# Patient Record
Sex: Female | Born: 1935
Health system: Southern US, Community
[De-identification: ages and names within clinical notes are randomized; demographics above are authoritative.]

## PROBLEM LIST (undated history)

## (undated) DIAGNOSIS — I679 Cerebrovascular disease, unspecified: Secondary | ICD-10-CM

## (undated) DIAGNOSIS — I10 Essential (primary) hypertension: Secondary | ICD-10-CM

## (undated) DIAGNOSIS — K529 Noninfective gastroenteritis and colitis, unspecified: Secondary | ICD-10-CM

## (undated) DIAGNOSIS — E039 Hypothyroidism, unspecified: Secondary | ICD-10-CM

## (undated) DIAGNOSIS — K810 Acute cholecystitis: Secondary | ICD-10-CM

## (undated) DIAGNOSIS — T4145XA Adverse effect of unspecified anesthetic, initial encounter: Secondary | ICD-10-CM

## (undated) DIAGNOSIS — E785 Hyperlipidemia, unspecified: Secondary | ICD-10-CM

## (undated) DIAGNOSIS — I2510 Atherosclerotic heart disease of native coronary artery without angina pectoris: Secondary | ICD-10-CM

## (undated) DIAGNOSIS — H3530 Unspecified macular degeneration: Secondary | ICD-10-CM

## (undated) DIAGNOSIS — T8859XA Other complications of anesthesia, initial encounter: Secondary | ICD-10-CM

## (undated) DIAGNOSIS — M1990 Unspecified osteoarthritis, unspecified site: Secondary | ICD-10-CM

## (undated) DIAGNOSIS — H18519 Endothelial corneal dystrophy, unspecified eye: Secondary | ICD-10-CM

## (undated) DIAGNOSIS — N179 Acute kidney failure, unspecified: Secondary | ICD-10-CM

## (undated) DIAGNOSIS — H1851 Endothelial corneal dystrophy: Secondary | ICD-10-CM

## (undated) DIAGNOSIS — K859 Acute pancreatitis without necrosis or infection, unspecified: Secondary | ICD-10-CM

## (undated) HISTORY — DX: Unspecified osteoarthritis, unspecified site: M19.90

## (undated) HISTORY — DX: Noninfective gastroenteritis and colitis, unspecified: K52.9

## (undated) HISTORY — DX: Unspecified macular degeneration: H35.30

## (undated) HISTORY — DX: Acute cholecystitis: K81.0

## (undated) HISTORY — DX: Essential (primary) hypertension: I10

## (undated) HISTORY — DX: Acute pancreatitis without necrosis or infection, unspecified: K85.90

## (undated) HISTORY — DX: Acute kidney failure, unspecified: N17.9

## (undated) HISTORY — DX: Cerebrovascular disease, unspecified: I67.9

## (undated) HISTORY — DX: Hypothyroidism, unspecified: E03.9

## (undated) HISTORY — DX: Atherosclerotic heart disease of native coronary artery without angina pectoris: I25.10

## (undated) HISTORY — PX: APPENDECTOMY: SHX54

## (undated) HISTORY — PX: BACK SURGERY: SHX140

## (undated) HISTORY — DX: Hyperlipidemia, unspecified: E78.5

## (undated) HISTORY — PX: CHOLECYSTECTOMY: SHX55

---

## 1998-03-12 ENCOUNTER — Ambulatory Visit (HOSPITAL_COMMUNITY): Admission: RE | Admit: 1998-03-12 | Discharge: 1998-03-12 | Payer: Self-pay | Admitting: *Deleted

## 1998-11-04 ENCOUNTER — Other Ambulatory Visit: Admission: RE | Admit: 1998-11-04 | Discharge: 1998-11-04 | Payer: Self-pay | Admitting: *Deleted

## 2000-02-19 ENCOUNTER — Other Ambulatory Visit: Admission: RE | Admit: 2000-02-19 | Discharge: 2000-02-19 | Payer: Self-pay | Admitting: *Deleted

## 2001-02-27 ENCOUNTER — Other Ambulatory Visit: Admission: RE | Admit: 2001-02-27 | Discharge: 2001-02-27 | Payer: Self-pay | Admitting: *Deleted

## 2003-03-28 ENCOUNTER — Other Ambulatory Visit: Admission: RE | Admit: 2003-03-28 | Discharge: 2003-03-28 | Payer: Self-pay | Admitting: *Deleted

## 2003-10-08 ENCOUNTER — Encounter: Admission: RE | Admit: 2003-10-08 | Discharge: 2003-11-11 | Payer: Self-pay | Admitting: *Deleted

## 2005-02-18 ENCOUNTER — Encounter: Admission: RE | Admit: 2005-02-18 | Discharge: 2005-02-18 | Payer: Self-pay | Admitting: Obstetrics and Gynecology

## 2005-07-22 ENCOUNTER — Encounter: Admission: RE | Admit: 2005-07-22 | Discharge: 2005-07-22 | Payer: Self-pay | Admitting: Obstetrics and Gynecology

## 2005-08-06 ENCOUNTER — Encounter: Admission: RE | Admit: 2005-08-06 | Discharge: 2005-08-06 | Payer: Self-pay | Admitting: Obstetrics and Gynecology

## 2005-10-19 ENCOUNTER — Ambulatory Visit (HOSPITAL_COMMUNITY): Admission: RE | Admit: 2005-10-19 | Discharge: 2005-10-19 | Payer: Self-pay | Admitting: Allergy

## 2006-10-12 ENCOUNTER — Encounter: Admission: RE | Admit: 2006-10-12 | Discharge: 2006-10-12 | Payer: Self-pay | Admitting: Obstetrics and Gynecology

## 2007-02-13 ENCOUNTER — Inpatient Hospital Stay (HOSPITAL_COMMUNITY): Admission: EM | Admit: 2007-02-13 | Discharge: 2007-02-24 | Payer: Self-pay | Admitting: *Deleted

## 2007-02-13 ENCOUNTER — Ambulatory Visit: Payer: Self-pay | Admitting: Cardiology

## 2007-02-14 HISTORY — PX: CARDIAC CATHETERIZATION: SHX172

## 2007-02-15 ENCOUNTER — Ambulatory Visit: Payer: Self-pay | Admitting: Cardiothoracic Surgery

## 2007-02-15 ENCOUNTER — Encounter: Payer: Self-pay | Admitting: Cardiology

## 2007-02-15 ENCOUNTER — Encounter: Payer: Self-pay | Admitting: Vascular Surgery

## 2007-02-15 HISTORY — PX: CORONARY ARTERY BYPASS GRAFT: SHX141

## 2007-03-13 ENCOUNTER — Ambulatory Visit: Payer: Self-pay | Admitting: Cardiology

## 2007-03-13 ENCOUNTER — Ambulatory Visit: Payer: Self-pay

## 2007-03-17 ENCOUNTER — Ambulatory Visit: Payer: Self-pay | Admitting: Cardiothoracic Surgery

## 2007-03-23 ENCOUNTER — Encounter (HOSPITAL_COMMUNITY): Admission: RE | Admit: 2007-03-23 | Discharge: 2007-06-21 | Payer: Self-pay | Admitting: Cardiology

## 2007-03-24 ENCOUNTER — Ambulatory Visit: Payer: Self-pay | Admitting: Cardiothoracic Surgery

## 2007-04-07 ENCOUNTER — Ambulatory Visit: Payer: Self-pay | Admitting: Cardiothoracic Surgery

## 2007-04-17 ENCOUNTER — Ambulatory Visit: Payer: Self-pay | Admitting: Cardiology

## 2007-05-23 ENCOUNTER — Ambulatory Visit: Payer: Self-pay | Admitting: Cardiology

## 2007-05-29 ENCOUNTER — Ambulatory Visit: Payer: Self-pay

## 2007-06-01 ENCOUNTER — Ambulatory Visit: Payer: Self-pay | Admitting: Cardiology

## 2007-06-01 LAB — CONVERTED CEMR LAB
CO2: 29 meq/L (ref 19–32)
Calcium: 9.2 mg/dL (ref 8.4–10.5)
Chloride: 104 meq/L (ref 96–112)
Creatinine, Ser: 1 mg/dL (ref 0.4–1.2)
Glucose, Bld: 86 mg/dL (ref 70–99)
Sodium: 138 meq/L (ref 135–145)

## 2007-06-02 ENCOUNTER — Ambulatory Visit: Payer: Self-pay | Admitting: Cardiovascular Disease

## 2007-06-22 ENCOUNTER — Encounter: Admission: RE | Admit: 2007-06-22 | Discharge: 2007-07-20 | Payer: Self-pay | Admitting: Cardiology

## 2007-06-28 ENCOUNTER — Ambulatory Visit: Payer: Self-pay | Admitting: Cardiology

## 2007-09-21 ENCOUNTER — Inpatient Hospital Stay (HOSPITAL_COMMUNITY): Admission: EM | Admit: 2007-09-21 | Discharge: 2007-09-23 | Payer: Self-pay | Admitting: Emergency Medicine

## 2007-09-28 ENCOUNTER — Encounter: Admission: RE | Admit: 2007-09-28 | Discharge: 2007-09-28 | Payer: Self-pay | Admitting: Gastroenterology

## 2007-10-02 ENCOUNTER — Ambulatory Visit (HOSPITAL_COMMUNITY): Admission: RE | Admit: 2007-10-02 | Discharge: 2007-10-02 | Payer: Self-pay | Admitting: Gastroenterology

## 2007-10-03 ENCOUNTER — Encounter (INDEPENDENT_AMBULATORY_CARE_PROVIDER_SITE_OTHER): Payer: Self-pay | Admitting: Gastroenterology

## 2007-10-03 ENCOUNTER — Ambulatory Visit (HOSPITAL_COMMUNITY): Admission: RE | Admit: 2007-10-03 | Discharge: 2007-10-03 | Payer: Self-pay | Admitting: Gastroenterology

## 2007-10-03 HISTORY — PX: HEMORRHOID SURGERY: SHX153

## 2007-10-05 ENCOUNTER — Ambulatory Visit: Payer: Self-pay | Admitting: Cardiology

## 2007-11-23 ENCOUNTER — Encounter: Admission: RE | Admit: 2007-11-23 | Discharge: 2007-11-23 | Payer: Self-pay | Admitting: Obstetrics and Gynecology

## 2007-12-11 ENCOUNTER — Ambulatory Visit: Payer: Self-pay | Admitting: Cardiology

## 2007-12-25 ENCOUNTER — Encounter: Admission: RE | Admit: 2007-12-25 | Discharge: 2007-12-25 | Payer: Self-pay | Admitting: Gastroenterology

## 2007-12-26 ENCOUNTER — Ambulatory Visit (HOSPITAL_COMMUNITY): Admission: RE | Admit: 2007-12-26 | Discharge: 2007-12-26 | Payer: Self-pay | Admitting: Gastroenterology

## 2007-12-26 HISTORY — PX: ERCP W/ SPHINCTEROTOMY AND BALLOON DILATION: SHX1524

## 2008-01-24 ENCOUNTER — Ambulatory Visit: Payer: Self-pay | Admitting: Cardiology

## 2008-04-15 ENCOUNTER — Encounter: Admission: RE | Admit: 2008-04-15 | Discharge: 2008-04-15 | Payer: Self-pay | Admitting: Gastroenterology

## 2008-05-06 ENCOUNTER — Ambulatory Visit: Payer: Self-pay | Admitting: Cardiology

## 2008-05-20 ENCOUNTER — Ambulatory Visit: Payer: Self-pay | Admitting: Cardiology

## 2008-05-20 LAB — CONVERTED CEMR LAB
ALT: 22 units/L (ref 0–35)
Alkaline Phosphatase: 30 units/L — ABNORMAL LOW (ref 39–117)
Bilirubin, Direct: 0.1 mg/dL (ref 0.0–0.3)
Cholesterol: 218 mg/dL (ref 0–200)
Direct LDL: 153.9 mg/dL
Total Bilirubin: 1.1 mg/dL (ref 0.3–1.2)
Total CHOL/HDL Ratio: 6.6

## 2008-10-28 ENCOUNTER — Ambulatory Visit: Payer: Self-pay | Admitting: Cardiology

## 2008-11-27 ENCOUNTER — Ambulatory Visit: Payer: Self-pay

## 2008-11-29 ENCOUNTER — Encounter: Admission: RE | Admit: 2008-11-29 | Discharge: 2008-11-29 | Payer: Self-pay | Admitting: Family Medicine

## 2009-02-04 ENCOUNTER — Ambulatory Visit: Payer: Self-pay | Admitting: Cardiology

## 2009-02-12 ENCOUNTER — Ambulatory Visit: Payer: Self-pay

## 2009-02-12 ENCOUNTER — Encounter: Payer: Self-pay | Admitting: Cardiology

## 2009-02-19 ENCOUNTER — Ambulatory Visit: Payer: Self-pay | Admitting: Cardiology

## 2009-03-11 DIAGNOSIS — I2510 Atherosclerotic heart disease of native coronary artery without angina pectoris: Secondary | ICD-10-CM | POA: Insufficient documentation

## 2009-03-11 DIAGNOSIS — I679 Cerebrovascular disease, unspecified: Secondary | ICD-10-CM

## 2009-03-11 DIAGNOSIS — E785 Hyperlipidemia, unspecified: Secondary | ICD-10-CM | POA: Insufficient documentation

## 2009-03-11 DIAGNOSIS — E039 Hypothyroidism, unspecified: Secondary | ICD-10-CM | POA: Insufficient documentation

## 2009-03-11 DIAGNOSIS — R079 Chest pain, unspecified: Secondary | ICD-10-CM | POA: Insufficient documentation

## 2009-03-11 DIAGNOSIS — I10 Essential (primary) hypertension: Secondary | ICD-10-CM | POA: Insufficient documentation

## 2009-03-12 ENCOUNTER — Encounter (INDEPENDENT_AMBULATORY_CARE_PROVIDER_SITE_OTHER): Payer: Self-pay | Admitting: *Deleted

## 2009-03-12 ENCOUNTER — Ambulatory Visit: Payer: Self-pay | Admitting: Cardiology

## 2009-03-12 ENCOUNTER — Encounter: Payer: Self-pay | Admitting: Cardiology

## 2009-05-12 LAB — CONVERTED CEMR LAB
ALT: 31 units/L (ref 0–35)
AST: 33 units/L (ref 0–37)
Albumin: 3.6 g/dL (ref 3.5–5.2)
Alkaline Phosphatase: 35 units/L — ABNORMAL LOW (ref 39–117)
BUN: 16 mg/dL (ref 6–23)
Chloride: 103 meq/L (ref 96–112)
Cholesterol: 148 mg/dL (ref 0–200)
Direct LDL: 74.1 mg/dL
HDL: 34 mg/dL — ABNORMAL LOW (ref >39.0)
Pro B Natriuretic peptide (BNP): 77 pg/mL (ref 0.0–100.0)
TSH: 1.55 microintl units/mL (ref 0.35–5.50)
Total Bilirubin: 0.8 mg/dL (ref 0.3–1.2)
Total CHOL/HDL Ratio: 4.4

## 2009-05-13 ENCOUNTER — Encounter (INDEPENDENT_AMBULATORY_CARE_PROVIDER_SITE_OTHER): Payer: Self-pay | Admitting: *Deleted

## 2009-07-14 ENCOUNTER — Encounter (INDEPENDENT_AMBULATORY_CARE_PROVIDER_SITE_OTHER): Payer: Self-pay | Admitting: *Deleted

## 2009-09-03 ENCOUNTER — Encounter: Payer: Self-pay | Admitting: Cardiology

## 2009-09-15 ENCOUNTER — Ambulatory Visit: Payer: Self-pay | Admitting: Cardiology

## 2009-12-09 ENCOUNTER — Encounter: Payer: Self-pay | Admitting: Cardiology

## 2009-12-10 ENCOUNTER — Ambulatory Visit: Payer: Self-pay

## 2009-12-10 ENCOUNTER — Encounter: Payer: Self-pay | Admitting: Cardiovascular Disease

## 2009-12-10 ENCOUNTER — Encounter: Payer: Self-pay | Admitting: Cardiology

## 2009-12-24 ENCOUNTER — Telehealth (INDEPENDENT_AMBULATORY_CARE_PROVIDER_SITE_OTHER): Payer: Self-pay | Admitting: *Deleted

## 2010-01-23 ENCOUNTER — Telehealth: Payer: Self-pay | Admitting: Cardiology

## 2010-01-26 ENCOUNTER — Inpatient Hospital Stay (HOSPITAL_COMMUNITY): Admission: EM | Admit: 2010-01-26 | Discharge: 2010-02-06 | Payer: Self-pay | Admitting: Emergency Medicine

## 2010-01-26 ENCOUNTER — Ambulatory Visit: Payer: Self-pay | Admitting: Cardiology

## 2010-01-27 ENCOUNTER — Encounter (INDEPENDENT_AMBULATORY_CARE_PROVIDER_SITE_OTHER): Payer: Self-pay

## 2010-01-28 HISTORY — PX: CHOLECYSTECTOMY, LAPAROSCOPIC: SHX56

## 2010-03-27 ENCOUNTER — Encounter
Admission: RE | Admit: 2010-03-27 | Discharge: 2010-03-27 | Payer: Self-pay | Source: Home / Self Care | Admitting: Gastroenterology

## 2010-08-12 ENCOUNTER — Encounter (INDEPENDENT_AMBULATORY_CARE_PROVIDER_SITE_OTHER): Payer: Self-pay | Admitting: *Deleted

## 2010-09-14 ENCOUNTER — Ambulatory Visit: Payer: Self-pay | Admitting: Cardiology

## 2010-12-25 ENCOUNTER — Encounter: Payer: Self-pay | Admitting: Cardiology

## 2010-12-25 ENCOUNTER — Telehealth: Payer: Self-pay | Admitting: Cardiology

## 2010-12-28 ENCOUNTER — Encounter (INDEPENDENT_AMBULATORY_CARE_PROVIDER_SITE_OTHER): Payer: Self-pay | Admitting: *Deleted

## 2011-01-19 NOTE — Progress Notes (Signed)
   Phone Note Other Incoming   Caller: Sara[facility] Action Taken: Information Sent Initial call taken by: Denny Peon    Faxed Doppler,12 lead,LOV over to [place] to fax [phone] [facility]  December 24, 2009 10:23 AM

## 2011-01-19 NOTE — Progress Notes (Signed)
        Additional Follow-up for Phone Call Additional follow up Details #2::    metoprolol needs 90 day suppl printed needs Dr Jens Som to sign will get it Monday when Dr Jens Som returns to the office Follow-up by: Kem Parkinson,  January 23, 2010 8:48 AM  Additional Follow-up for Phone Call Additional follow up Details #3:: Details for Additional Follow-up Action Taken: I got Dr Jens Som to sign the prescription sat med letter out in the lobby Additional Follow-up by: Kem Parkinson,  January 26, 2010 9:49 AM  Prescriptions: METOPROLOL SUCCINATE 50 MG XR24H-TAB (METOPROLOL SUCCINATE) Take one tablet by mouth daily  #90.0 Each x 3   Entered by:   Kem Parkinson   Authorized by:   Ferman Hamming, MD, [facility]   Signed by:   Kem Parkinson on 01/26/2010   Method used:   Print then Give to Patient   RxID:   [id number]

## 2011-01-19 NOTE — Consult Note (Signed)
[facility]: [facility]   Imported By: Roderic Ovens 02/09/2010 15:05:10  _____________________________________________________________________  External Attachment:    Type:   Image     Comment:   External Document

## 2011-01-19 NOTE — Assessment & Plan Note (Signed)
Summary: F1Y/DM  Medications Added SYNTHROID 50 MCG TABS (LEVOTHYROXINE SODIUM) 1 tab by mouth once daily.Marland Kitchenexcept the days she takes the 75mg  BIOTIN 1000 MCG TABS (BIOTIN) 1 tab by mouth once daily SYNTHROID 75 MCG TABS (LEVOTHYROXINE SODIUM) 2 times a week        Primary [name]:  knapp,eve   History of Present Illness: [name] returns for followup today. She had coronary artery bypass and graft in 2008. When I saw her back in February of 2010 she was complaining of shortness of breath and chest pain.  An echocardiogram in February showed normal LV function, mild left atrial enlargement, and trivial mitral regurgitation.  A Myoview in February showed no ischemia or infarction and an ejection fraction of 69%.  We also scheduled her to have a D-dimer due to her dyspnea, which was normal.  A BNP was also scheduled due to her dyspnea and it was normal at 77.  Finally, a chest x-ray showed no active lung disease. Last carotid Dopplers were performed in December of 2010. There was 0-39% bilateral stenosis. I last saw her in September of 2010. Since then, the patient denies any dyspnea on exertion, orthopnea, PND, pedal edema, palpitations, syncope or chest pain.   Current Medications (verified): 1)  Synthroid 50 Mcg Tabs (Levothyroxine Sodium) .Marland Kitchen.. 1 Tab By Mouth Once Daily.Marland Kitchenexcept The Days She Takes The 75mg  2)  Metoprolol Succinate 50 Mg Xr24h-Tab (Metoprolol Succinate) .... Take One Tablet By Mouth Daily 3)  Mavik 1 Mg Tabs (Trandolapril) .Marland Kitchen.. 1 Tab By Mouth Once Daily 4)  Aspirin 81 Mg Tbec (Aspirin) .... Take One Tablet By Mouth Daily 5)  Fish Oil   Oil (Fish Oil) .... 2 Tabs By Mouth Once Daily 6)  Coenzyme Q10 10 Mg Caps (Coenzyme Q10) .Marland Kitchen.. 1 Tab By Mouth Once Daily 7)  Glucosamine-Chondroitin   Caps (Glucosamine-Chondroit-Vit C-Mn) .Marland Kitchen.. 1 Tab By Mouth Once Daily 8)  Centrum Silver  Tabs (Multiple Vitamins-Minerals) .Marland Kitchen.. 1 Tab By Mouth Once Daily 9)  Biotin 1000 Mcg Tabs (Biotin) .Marland Kitchen..  1 Tab By Mouth Once Daily 10)  Colace 50 Mg Caps (Docusate Sodium) .... As Needed 11)  Tums 500 Mg Chew (Calcium Carbonate Antacid) .... As Needed 12)  Synthroid 75 Mcg Tabs (Levothyroxine Sodium) .... 2 Times A Week  Past History:  Past Medical History: CEREBROVASCULAR DISEASE (ICD-437.9) HYPOTHYROIDISM (ICD-244.9) HYPERLIPIDEMIA (ICD-272.4) HYPERTENSION (ICD-401.9) CORONARY ARTERY DISEASE (ICD-414.00) Chronic diarrhea   Arthritis.  Pancreatitis History of ARF  with Dilantin.  Past Surgical History:  status post coronary artery bypass graft.  Endoscopic retrograde cholangiopancreatography,   sphincterotom  Social History: Reviewed history from 03/11/2009 and no changes required.  The patient volunteers at the [employer].  She  used to be a CNA and worked the ICU.  Tobacco Use - No.  Alcohol Use - no  Review of Systems       no fevers or chills, productive cough, hemoptysis, dysphasia, odynophagia, melena, hematochezia, dysuria, hematuria, rash, seizure activity, orthopnea, PND, pedal edema, claudication. Remaining systems are negative.   Vital Signs:  Patient profile:   77 year old female Height:      62 inches Weight:      157 pounds BMI:     28.82 Pulse rate:   92 / minute Resp:     12 per minute BP sitting:   122 / 79  (left arm)  Vitals Entered By: [name] (September 14, 2010 2:37 PM)  Physical Exam  General:  Well-developed well-nourished in  no acute distress.  Skin is warm and dry.  HEENT is normal.  Neck is supple. No thyromegaly.  Chest is clear to auscultation with normal expansion.  Cardiovascular exam is regular rate and rhythm.  Abdominal exam nontender or distended. No masses palpated. Extremities show no edema. neuro grossly intact    EKG  Procedure date:  09/14/2010  Findings:      Sinus rhythm at a rate of 89. First degree AV block. Right bundle branch block she  Impression & Recommendations:  Problem # 1:   CEREBROVASCULAR DISEASE (ICD-437.9) Continue aspirin. Patient refuses statins.  Problem # 2:  HYPOTHYROIDISM (ICD-244.9)  Her updated medication list for this problem includes:    Synthroid 50 Mcg Tabs (Levothyroxine sodium) .Marland Kitchen... 1 tab by mouth once daily.Marland Kitchenexcept the days she takes the 75mg     Synthroid 75 Mcg Tabs (Levothyroxine sodium) .Marland Kitchen... 2 times a week  Problem # 3:  HYPERLIPIDEMIA (ICD-272.4) Continue diet. Patient refuses statins. The following medications were removed from the medication list:    Pravachol 40 Mg Tabs (Pravastatin sodium) .Marland Kitchen... 1 tab by mouth once daily  Problem # 4:  HYPERTENSION (ICD-401.9) Blood pressure controlled on present medications. Will continue. Potassium and renal function monitored by primary care. Her updated medication list for this problem includes:    Metoprolol Succinate 50 Mg Xr24h-tab (Metoprolol succinate) .Marland Kitchen... Take one tablet by mouth daily    Mavik 1 Mg Tabs (Trandolapril) .Marland Kitchen... 1 tab by mouth once daily    Aspirin 81 Mg Tbec (Aspirin) .Marland Kitchen... Take one tablet by mouth daily  Problem # 5:  CORONARY ARTERY DISEASE (ICD-414.00) Continue present medications. Her updated medication list for this problem includes:    Metoprolol Succinate 50 Mg Xr24h-tab (Metoprolol succinate) .Marland Kitchen... Take one tablet by mouth daily    Mavik 1 Mg Tabs (Trandolapril) .Marland Kitchen... 1 tab by mouth once daily    Aspirin 81 Mg Tbec (Aspirin) .Marland Kitchen... Take one tablet by mouth daily

## 2011-01-19 NOTE — Letter (Signed)
Summary: Appointment - Reschedule  [facility], Main Office  [address]   [place], [place] [address]   Phone: [phone]  Fax: [phone]     August 12, 2010 MRN: [id number]   [facility] [address] [place], [place]  [address]   Dear Ms. Godbolt,   Due to a change in our office schedule, your appointment on 09-16-2010  at  10:00 a.m.  must be changed.  It is very important that we reach you to reschedule this appointment. We look forward to participating in your health care needs. Please contact us at the number listed above at your earliest convenience to reschedule this appointment.     Sincerely,     Lorne Skeens  [facility] Scheduling Team

## 2011-01-21 ENCOUNTER — Inpatient Hospital Stay: Admission: RE | Admit: 2011-01-21 | Payer: Self-pay | Source: Home / Self Care | Admitting: Orthopedic Surgery

## 2011-01-21 NOTE — Progress Notes (Signed)
Summary: surgical clearance   Phone Note Call from Patient Call back at Home Phone [phone]   Caller: Patient Summary of Call: pt is to have shoulder surgery and needs surgical clearance. Pt needs clearance to have. Does pt need to be seen or can clearance be sent to [facility] send clearance to Imelda Pillow at fax [phone]. Ofc number is [phone] or [phone].  Initial call taken by: Edman Circle,  December 25, 2010 10:50 AM  Follow-up for Phone Call        pt aware will foward for dr Jens Som review. she is currently having no chest pain or SOB. will foward for dr Jens Som review Deliah Goody, RN  December 25, 2010 11:47 AM   Additional Follow-up for Phone Call Additional follow up Details #1::        ok for surgery Ferman Hamming, MD, [facility]  December 25, 2010 6:34 PM  pt aware, info faxed to dr supple's office Deliah Goody, RN  December 28, 2010 2:12 PM

## 2011-01-21 NOTE — Letter (Signed)
Summary: Clearance Letter  [facility], Main Office  [address]   [place], [place] [address]   Phone: [phone]  Fax: [phone]    December 28, 2010  Re:     Rusty Aus Address:   [address]     [place], [place]  [address] DOB:     [birth year] MRN:     [id number]   Dear Dr Rennis Chris,       Mrs Southgate is low risk cardiac wise for shoulder surgery. Please call with any questions or concern.    Sincerely,  Deliah Goody, RN/Dr Olga Millers

## 2011-01-27 NOTE — Letter (Signed)
Summary: [name] Office Visit Note   [facility] Office Visit Note   Imported By: Roderic Ovens 01/22/2011 15:42:16  _____________________________________________________________________  External Attachment:    Type:   Image     Comment:   External Document  Appended Document: [name] Office Visit Note  ok for surgery

## 2011-03-10 LAB — CBC
HCT: 36.5 % (ref 36.0–46.0)
HCT: 37.8 % (ref 36.0–46.0)
HCT: 38 % (ref 36.0–46.0)
HCT: 41.5 % (ref 36.0–46.0)
Hemoglobin: 11.6 g/dL — ABNORMAL LOW (ref 12.0–15.0)
Hemoglobin: 12.7 g/dL (ref 12.0–15.0)
Hemoglobin: 12.8 g/dL (ref 12.0–15.0)
Hemoglobin: 12.9 g/dL (ref 12.0–15.0)
Hemoglobin: 14.3 g/dL (ref 12.0–15.0)
MCHC: 34.5 g/dL (ref 30.0–36.0)
MCHC: 34.6 g/dL (ref 30.0–36.0)
MCHC: 34.8 g/dL (ref 30.0–36.0)
MCHC: 35 g/dL (ref 30.0–36.0)
MCHC: 35.1 g/dL (ref 30.0–36.0)
MCV: 94 fL (ref 78.0–100.0)
MCV: 94.1 fL (ref 78.0–100.0)
MCV: 94.3 fL (ref 78.0–100.0)
MCV: 94.8 fL (ref 78.0–100.0)
MCV: 96.1 fL (ref 78.0–100.0)
Platelets: 225 10*3/uL (ref 150–400)
Platelets: 345 10*3/uL (ref 150–400)
Platelets: 456 10*3/uL — ABNORMAL HIGH (ref 150–400)
Platelets: 566 10*3/uL — ABNORMAL HIGH (ref 150–400)
Platelets: 660 10*3/uL — ABNORMAL HIGH (ref 150–400)
RBC: 3.51 MIL/uL — ABNORMAL LOW (ref 3.87–5.11)
RBC: 3.85 MIL/uL — ABNORMAL LOW (ref 3.87–5.11)
RBC: 3.87 MIL/uL (ref 3.87–5.11)
RBC: 3.91 MIL/uL (ref 3.87–5.11)
RBC: 4.39 MIL/uL (ref 3.87–5.11)
RDW: 12.4 % (ref 11.5–15.5)
RDW: 12.5 % (ref 11.5–15.5)
RDW: 12.8 % (ref 11.5–15.5)
WBC: 11.5 10*3/uL — ABNORMAL HIGH (ref 4.0–10.5)
WBC: 12 10*3/uL — ABNORMAL HIGH (ref 4.0–10.5)
WBC: 16 10*3/uL — ABNORMAL HIGH (ref 4.0–10.5)
WBC: 16.1 10*3/uL — ABNORMAL HIGH (ref 4.0–10.5)
WBC: 17.4 10*3/uL — ABNORMAL HIGH (ref 4.0–10.5)

## 2011-03-10 LAB — BASIC METABOLIC PANEL
CO2: 23 mEq/L (ref 19–32)
Calcium: 7.8 mg/dL — ABNORMAL LOW (ref 8.4–10.5)
Chloride: 105 mEq/L (ref 96–112)
Creatinine, Ser: 0.86 mg/dL (ref 0.4–1.2)
Glucose, Bld: 177 mg/dL — ABNORMAL HIGH (ref 70–99)
Sodium: 134 mEq/L — ABNORMAL LOW (ref 135–145)

## 2011-03-10 LAB — CLOSTRIDIUM DIFFICILE EIA

## 2011-03-10 LAB — COMPREHENSIVE METABOLIC PANEL
ALT: 53 U/L — ABNORMAL HIGH (ref 0–35)
AST: 50 U/L — ABNORMAL HIGH (ref 0–37)
Albumin: 2.1 g/dL — ABNORMAL LOW (ref 3.5–5.2)
Albumin: 2.2 g/dL — ABNORMAL LOW (ref 3.5–5.2)
Albumin: 3.6 g/dL (ref 3.5–5.2)
Alkaline Phosphatase: 38 U/L — ABNORMAL LOW (ref 39–117)
Alkaline Phosphatase: 54 U/L (ref 39–117)
BUN: 21 mg/dL (ref 6–23)
CO2: 23 mEq/L (ref 19–32)
CO2: 26 mEq/L (ref 19–32)
Calcium: 7.4 mg/dL — ABNORMAL LOW (ref 8.4–10.5)
Calcium: 7.8 mg/dL — ABNORMAL LOW (ref 8.4–10.5)
Creatinine, Ser: 0.93 mg/dL (ref 0.4–1.2)
Creatinine, Ser: 0.98 mg/dL (ref 0.4–1.2)
Creatinine, Ser: 1.17 mg/dL (ref 0.4–1.2)
GFR calc Af Amer: >60 mL/min (ref >60)
GFR calc non Af Amer: 55 mL/min — ABNORMAL LOW (ref >60)
GFR calc non Af Amer: 58 mL/min — ABNORMAL LOW (ref >60)
Potassium: 3.4 mEq/L — ABNORMAL LOW (ref 3.5–5.1)
Potassium: 4 mEq/L (ref 3.5–5.1)
Sodium: 136 mEq/L (ref 135–145)
Total Bilirubin: 1.3 mg/dL — ABNORMAL HIGH (ref 0.3–1.2)
Total Protein: 5.7 g/dL — ABNORMAL LOW (ref 6.0–8.3)
Total Protein: 6.2 g/dL (ref 6.0–8.3)
Total Protein: 7.8 g/dL (ref 6.0–8.3)

## 2011-03-10 LAB — URINALYSIS, ROUTINE W REFLEX MICROSCOPIC
Glucose, UA: NEGATIVE mg/dL
Nitrite: NEGATIVE
Protein, ur: 30 mg/dL — AB
Specific Gravity, Urine: 1.021 (ref 1.005–1.030)
Urobilinogen, UA: 1 mg/dL (ref 0.0–1.0)

## 2011-03-10 LAB — URINALYSIS, MICROSCOPIC ONLY
Bilirubin Urine: NEGATIVE
Hgb urine dipstick: NEGATIVE
Nitrite: NEGATIVE
Protein, ur: 30 mg/dL — AB
Specific Gravity, Urine: 1.02 (ref 1.005–1.030)
Urobilinogen, UA: 0.2 mg/dL (ref 0.0–1.0)

## 2011-03-10 LAB — DIFFERENTIAL
Basophils Absolute: 0 10*3/uL (ref 0.0–0.1)
Eosinophils Absolute: 0 10*3/uL (ref 0.0–0.7)
Eosinophils Relative: 0 % (ref 0–5)
Monocytes Absolute: 1.5 10*3/uL — ABNORMAL HIGH (ref 0.1–1.0)
Neutro Abs: 16.5 10*3/uL — ABNORMAL HIGH (ref 1.7–7.7)
WBC Morphology: INCREASED

## 2011-03-10 LAB — URINE MICROSCOPIC-ADD ON

## 2011-03-10 LAB — URINE CULTURE: Colony Count: 45000

## 2011-05-04 NOTE — Assessment & Plan Note (Signed)
[facility]                            CARDIOLOGY OFFICE NOTE   NAME:STEENAcelyn, Basham                     MRN:          [id number]  DATE:06/01/2007                            DOB:          16-Dec-1929    REASON FOR VISIT:  Followup cardiac testing.   HISTORY OF PRESENT ILLNESS:  I saw Ms. Hoskie in the office back in  April, at which time she was recuperating well following coronary artery  bypass grafting in February. She came into the office and saw Dr. Juanda Chance  in early June with complaints of dizziness and some episodes of chest  heaviness and shortness of breath. She had had no palpitations or frank  syncope. She did report that most of her dizziness seemed to occur when  she wound turn her head a certain way raising the question of possible  labyrinthitis or inner ear problem. She has discontinued her Crestor  after calling the pharmacy and states the dizziness has improved  although not back to baseline. She also notes an improvement in her  shortness of breath. Dr. Juanda Chance referred her for a follow up Myoview  which was very reassuring, showing an ejection fraction of 82% with no  scar or ischemia. I reviewed this with her today. We talked about  staying off of the Crestor for now, and also planned a follow up CT of  the chest given her fairly abrupt onset of shortness of breath and chest  discomfort, which has waxed and waned somewhat. She is not having any  cough or hemoptysis and she has had no tachy palpitations. Otherwise her  blood pressure has been well controlled and she states that she just  recently had a good cardiac rehab session progressing up to the  treadmill without difficultly.   ALLERGIES:  DILANTIN, EPINEPHRINE, VALIUM.   PRESENT MEDICATIONS:  1. Metoprolol 50 mg p.o. daily.  2. Crestor 10 mg p.o. daily (on hold).  3. Mavik 1 mg p.o. daily.  4. Omega III supplements 1200 mg p.o. daily.  5. Biotin5000 mcg daily.  6.  Glucosamine chondroitin.  7. Multivitamin daily.  8. Aspirin 81 mg p.o. daily.  9. Synthroid 0.05 mg p.o. daily.  10.Colace 100 mg p.o. daily.   REVIEW OF SYSTEMS:  As described in history of present illness.  Otherwise negative.   PHYSICAL EXAMINATION:  Blood pressure by me is 124/72, heart rate is 69  and regular, weight is 151 pounds which is stable. Patient is  comfortable and in no acute distress without active symptoms.  HEENT: Conjunctivae are normal. Oropharynx is clear.  NECK: Supple. No elevated jugular venous pressure, without bruits. No  thyromegaly is noted.  LUNGS: Clear without labored breathing at rest.  CARDIAC EXAM: Reveals a regular rate and rhythm, no loud murmur, or  gallop.  ABDOMEN: Soft, nontender, normoactive bowel sounds.  EXTREMITIES: Show no pitting edema.  SKIN: Warm and dry.  MUSCULOSKELETAL: No kyphosis noted.  NEURO/PSYCHIATRIC: Patient alert and oriented x3. Affect is normal.   IMPRESSION/RECOMMENDATIONS:  1. Multi vessel coronary artery disease, status post coronary artery  bypass grafting in February. Recent follow up Myoview was normal.      She actually reports an improvement in her symptoms although not      back to baseline as yet. I have recommended a follow up contrasted      CT scan of the chest to exclude any underlying pulmonary etiology      and exclude thromboembolic disease, although I think this is      relatively unlikely. I will then have her keep her next visit with      me in early July to see how she is doing.  2. Dizziness, possibly positional vertigo or viral labyrinthitis based      on her description. This seems to be improving. She also relates      the improvement to being off of Crestor. For the time being I will      leave her off of this medicine and we can reassess things when I      see her back.  3. Further plans to follow up.     Jonelle Sidle, MD  Electronically Signed    SGM/MedQ  DD: 06/01/2007   DT: 06/01/2007  Job #: [id number]

## 2011-05-04 NOTE — Op Note (Signed)
[name], [name]              ACCOUNT NO.:  [id number]   MEDICAL RECORD NO.:  [id number]          PATIENT TYPE:  AMB   LOCATION:  ENDO                         FACILITY:  [facility]   PHYSICIAN:  Petra Kuba, M.D.    DATE OF BIRTH:  20-May-1929   DATE OF PROCEDURE:  10/03/2007  DATE OF DISCHARGE:                               OPERATIVE REPORT   PROCEDURE:  Colonoscopy.   INDICATIONS:  Diarrhea.  Consent was signed after risks, benefits,  methods, and options were thoroughly discussed multiple times in the  past.   MEDICINES USED:  Fentanyl 60 mcg, Versed 6 mg.   PROCEDURE IN DETAIL:  Rectal inspection is pertinent for external  hemorrhoids, small. Digital exam was negative.  The pediatric video  colonoscope was inserted and fairly easily advanced around the colon to  the cecum.  No significant abnormalities were seen on insertion. To  advance to the cecum required abdominal pressure but no position  changes.  There was some mild melanosis coli in the cecum which was  identified by the appendiceal orifice and ileocecal valve.  The scope  was inserted a short ways in the terminal ileum which was normal.  Photo  documentation was obtained. A few random biopsies of the TI were  obtained, the TI was normal.  The scope was slowly withdrawn.  The prep  was adequate.  There was some liquid stool that required washing and  suctioning. On slow withdrawal through the colon, other than the  melanosis coli in the right side, the right side of the colon was  normal.  A few scattered biopsies were obtained of all parts of the  colon and put in the second container.  As the scope was slowly  withdrawn, a tiny mid descending polyp was seen and was cold biopsied x2  and put in the third container.  There was a rare left sided  diverticula, small, as we slowly withdrew back to the rectum.  Once back  in the rectum, the scope was retroflexed pertinent for some internal  hemorrhoids and a tiny distal  rectal polyp which was cold biopsied x2  and put in the same container with the other polyp.  The scope was  straightened and readvanced a short ways up the left side of the colon,  air was suctioned, the scope removed.  The patient tolerated the  procedure well.  There was no obvious immediate complication.   ENDOSCOPIC DIAGNOSES:  1. Internal and external small hemorrhoids.  2. Rare left sided diverticula.  3. Mild right sided melanosis coli.  4. Two tiny polyps rectum and descending cold, biopsied.  5. Otherwise, within normal limits to the terminal ileum status post      random biopsies throughout.   PLAN:  Await pathology.  Call me p.r.n.  Observe for signs of recurrent  CBD stones, although yesterday we elected, after a prolonged  conversation about the risks and benefits of ERCP, since her upper  abdominal pain is gone and she has been eating better, to hold off on  the procedure. Her son agrees.  ______________________________  Petra Kuba, M.D.     MEM/MEDQ  D:  10/03/2007  T:  10/03/2007  Job:  [id number]   cc:   Jonelle Sidle, MD  Lavonda Jumbo, M.D.

## 2011-05-04 NOTE — H&P (Signed)
NAMEMARTIE, MUHLBAUER              ACCOUNT NO.:  [id number]   MEDICAL RECORD NO.:  [id number]          PATIENT TYPE:  INP   LOCATION:  1826                         FACILITY:  [facility]   PHYSICIAN:  Hollice Espy, M.D.DATE OF BIRTH:  1929-02-04   DATE OF ADMISSION:  09/21/2007  DATE OF DISCHARGE:                              HISTORY & PHYSICAL   PRIMARY CARE PHYSICIAN:  Lavonda Jumbo, M.D.   CHIEF COMPLAINT:  Belly pain.   HISTORY OF PRESENT ILLNESS:  The patient is a 77 year old white female  with a past medical history of CAD, status post CABG 7 months ago who  for the past 6 months has had issues with frequent intermittent diarrhea  regardless of diet or medications used.  She continues to have this  chronic diarrhea.  She has had no abdominal pain with this nor any  nausea or vomiting.  However, in the past week she has had a sense of  upper quadrant right-to-left uncomfortable sensation as well as some  nausea.  The symptoms persisted, so she saw her PCP who ordered some  routine lab work.  According to the patient, this lab work returned back  with elevated transaminases and either an elevated alkaline phosphatase  or elevated lipase.  The patient was supposed to follow up but then  today she started having severe pain in the mid epigastric area and  called her PCP who advised the patient to come into the emergency room.  In the emergency room, the patient had more labs done and her  transaminases were found to be mildly elevated with numbers in the 300s.  Her alkaline phosphatase, however, was found to be normal.  Most  concerning, however, was her lipase level which was found to be greater  than 2,000.  The rest of her labs including cardiac markers, coags,  white count, and renal function were all within normal limits.  The  patient was felt to have pancreatitis, although the etiology was not  immediately clear and a right upper quadrant ultrasound has been ordered  which  is pending.  The patient is feeling a little bit better.  She was  given some IV fluids and she is already starting to feel better.  She denies any headache, visual changes, dysphagia, chest pain,  palpitations, shortness of breath, wheezing, coughing.  Currently, she  complains of some mild abdominal discomfort radiating from all across  her upper abdominal area.  No hematuria or dysuria.  No constipation.  She has had no diarrhea today.  No focal extremity numbness, weakness,  or pain.   REVIEW OF SYSTEMS:  Otherwise negative.   PAST MEDICAL HISTORY:  1. Chronic diarrhea for the last 6 months.  2. CAD, status post CABG 7 months ago.  3. Hypothyroidism.  4. Arthritis.   She is on:  1. Aspirin 81 p.o. daily.  2. Biotin.  3. Colace which she stopped taking ever since she started having      diarrhea.  4. Fish oil daily.  5. Glucosamine daily.  6. Mavik 2 mg p.o. daily.  7. Metoprolol 50 mg  p.o. q.h.s.  8. Multivitamin daily.  9. Synthroid daily.   She has no known drug allergies.   SOCIAL HISTORY:  She denies any tobacco, alcohol, or drug use.   FAMILY HISTORY:  Noncontributory.   PHYSICAL EXAMINATION:  VITAL SIGNS:  On admission temp 98.3, heart rate  78, blood pressure 147/82, respirations 18, O2 sat 95% on room air.  GENERAL:  She is alert and oriented x3, no apparent distress.  HEENT:  Normocephalic atraumatic.  Her mucous membranes are dry.  NECK:  She has no carotid bruits.  HEART:  Regular rate and rhythm.  S1 S2.  LUNGS:  Clear to auscultation bilaterally.  ABDOMEN:  Soft, obese, some very mild generalized tenderness.  Decreased  bowel sounds.  EXTREMITIES:  Show no clubbing cyanosis.  Trace pitting edema.   LABORATORY:  White count 9.7, H&H 14.9 and 44, MCV of 92, platelet count  299, no shift.  Coags unremarkable.  Lipase greater than 2,000.  Sodium  137, potassium 4.1, chloride 101, bicarb 25, BUN 14, creatinine 1,  glucose 97.  LFTs are noted for an albumin  of 4, a normal alkaline  phosphatase and transaminases.  AST 391, ALT 247.  CPK 100, MB 1.1,  troponin I less than 0.05.  Right upper quadrant ultrasound is pending.   ASSESSMENT/PLAN:  1. Pancreatitis.  Etiology is not immediately known, although with the      patient's complaints of some nausea and pain across her upper      quadrants suspicious for cholecystitis.  We will check a right      upper quadrant ultrasound and continue to follow.  Make the patient      nothing by mouth, put her on intravenous Protonix, pain and nausea      medications, repeat lipase level in the morning.  2. Coronary artery disease, status post coronary artery bypass graft.      Continue her metoprolol, holding her aspirin and other medications      for now.  3. Hypothyroidism holding Synthroid.   Please also note that cause may not be immediately available for her  pancreatitis.  We will plan once her pancreatitis has settled down, she  will need a possible outpatient scan to rule out pancreatic mass  especially if her right upper quadrant ultrasound is negative.  We will  consider a CT of the abdomen.      Hollice Espy, M.D.  Electronically Signed     SKK/MEDQ  D:  09/21/2007  T:  09/21/2007  Job:  [id number]   cc:   Lavonda Jumbo, M.D.

## 2011-05-04 NOTE — Assessment & Plan Note (Signed)
[facility]                            CARDIOLOGY OFFICE NOTE   NAME:STEENChan, Rosasco                     MRN:          [id number]  DATE:10/28/2008                            DOB:          [birth year]    [name] is a very pleasant 77 year old female who has previously been  followed by Dr. Diona Browner.  She does have a history of coronary artery  disease status post coronary bypassing graft.  This was done on February 15, 2007.  At that time, she had a LIMA to the LAD, status post vein  graft to the diagonal, status post vein graft to the circumflex  marginal, and status post vein graft to the right coronary artery.  She  did have an echocardiogram on February 15, 2007, that showed vigorous LV  function with an ejection fraction of 60-65%.  There was mild mitral  regurgitation.  There was mild RVE.  There was mild RAE.  Her last  Myoview was performed on May 29, 2007.  It was normal with an ejection  fraction of 82%.  Since she was last seen, she had done well with no  dyspnea, chest pain, palpitations, or syncope.  There is no pedal edema.  She has not had syncope.   MEDICATIONS:  1. Mavik 1 mg p.o. daily.  2. Fish oil.  3. Glucosamine.  4. Multivitamin.  5. Aspirin 81 mg p.o. daily.  6. Colace.  7. Synthroid 0.5 mg p.o. daily.  8. Tums.  9. Toprol 50 mg daily.  10.Pravachol 40 mg p.o. daily.  11.Biotin.  12.Coenzyme Q.   PHYSICAL EXAMINATION:  VITAL SIGNS:  Today shows a blood pressure of  117/77 and a pulse is 92.  She weighs 156 pounds.  HEENT:  Normal.  NECK:  Supple.  CHEST:  Clear.  CARDIOVASCULAR:  Regular rate and rhythm.  ABDOMEN:  No tenderness.  EXTREMITIES:  No edema.   Electrocardiogram shows sinus rhythm with a first-degree AV block.  There is right bundle-branch block.   DIAGNOSES:  1. Coronary artery disease status post coronary bypassing graft - We      will continue with her aspirin and her Pravachol as well as  her      beta-blocker and ACE inhibitor.  Note, her recent Myoview showed no      ischemia or infarction.  She will continue with risk factor      modification.  She does not smoke.  2. Hypertension - Her blood pressure is adequately controlled on her      present medications.  3. Hyperlipidemia - We will have her most recent lipids and liver      forwarded to [place] for records.  She will continue on her Pravachol.      Note, she has been intolerant to Crestor, Lipitor, and Zocor in the      past.  4. History of intolerance to Zocor, Lipitor, and Crestor.  5. Hypothyroidism.  6. Cerebrovascular disease - She did have moderate cerebrovascular      disease at the time of her recent surgery.  We will plan  to repeat      her carotid Dopplers.   We will plan to see her back in 1 year or sooner if necessary.     Madolyn Frieze Jens Som, MD, [facility]  Electronically Signed    BSC/MedQ  DD: 10/28/2008  DT: 10/29/2008  Job #: [id number]   cc:   Lavonda Jumbo, M.D.

## 2011-05-04 NOTE — Assessment & Plan Note (Signed)
[facility]                            CARDIOLOGY OFFICE NOTE   NAME:STEENCherylin, Waguespack                     MRN:          [id number]  DATE:06/28/2007                            DOB:          [birth year]    FOLLOWUP VISIT   PRIMARY CARE PHYSICIAN:  Dr. Lavonda Jumbo, M.D.   REASON FOR VISIT:  Cardiac followup.   HISTORY OF PRESENT ILLNESS:  I saw Ms. Mittleman back in June.  She had been  experiencing some dizziness at that time and also intermittent chest  pain.  We had already referred her for followup ischemic evaluation  subsequent to her bypass grafting in February.  She had a Myoview that  was normal in June.  I proceeded further with a CT scan of the chest  that revealed no evidence of pulmonary embolus, some mild mediastinal  adenopathy, and atelectasis of the right lower and middle lobe.  Generally, Ms. Hush reports that she has done better.  Her dyspnea has  completely resolved, and she attributes this to Crestor, which she had  discontinued.  We talked about substituting another medicine, but she  was not ready to consider this.  She completes formal cardiac  rehabilitation within the next week and I congratulated her on this.  She reports having no difficulties with symptoms when she exercises in  this program, but still has a feeling of heaviness in her chest when she  exercises outdoors.  I still wonder if she has some component of  reactive airway disease.  Her tests overall are reassuring and I have  encouraged her to continue a regular basic exercise regimen even when  she completes her cardiac rehab.  We can revisit a different type of  Statin therapy along the way.   ALLERGIES:  DILANTIN, EPINEPHRINE, VALIUM, and also intolerance to  CRESTOR.   PRESENT MEDICATIONS:  1. Metoprolol 50 mg p.o. daily.  2. Mavik 1 mg p.o. daily.  3. Omega-3 fish oil supplements 1200 mg p.o. daily.  4. Biotin 5000 daily.  5. Glucosamine chondroitin.  6. Multivitamin.  7. Aspirin 81 mg p.o. daily.  8. Synthroid 0.05 mg p.o. daily.  9. Colace 100 mg p.o. daily.   REVIEW OF SYSTEMS:  As described in the history of present illness.   EXAMINATION:  Blood pressure is 119/60, heart rate is 66, weight 151  pounds.  The patient is comfortable and in no acute distress.  NECK:  No elevated jugular venous pressure or loud bruits.  LUNGS:  Clear.  No active wheezing or labored breathing.  CARDIAC:  Regular rate and rhythm.  Sternal incision is well healed.  Chest wall is stable.  EXTREMITIES:  No pitting edema.   IMPRESSION/RECOMMENDATIONS:  1. Multivessel coronary artery disease status post coronary artery      bypass grafting in February of this year.  Followup Myoview      indicated.  No ischemia with normal ejection fraction.  CT scan of      the chest also reveals no obvious thromboembolic disease, masses,      or infiltrates.  I have  encouraged Ms. Gerber to continue her      regular exercise regimen and medical therapy.  I would like to see      her back over the next 3 months.  I do anticipate that she will      need a trial of a different type of Statin medication ultimately,      although at this point, she is not ready to consider this.  She      will otherwise continue to follow up with Dr. Lynelle Doctor.  2. Further plan is to follow.     Jonelle Sidle, MD  Electronically Signed    SGM/MedQ  DD: 06/28/2007  DT: 06/28/2007  Job #: [id number]   cc:   Lavonda Jumbo, M.D.

## 2011-05-04 NOTE — Assessment & Plan Note (Signed)
[facility]                            CARDIOLOGY OFFICE NOTE   NAME:[name], [name]                     MRN:          [id number]  DATE:05/23/2007                            DOB:          April 12, 1929    CARDIOLOGIST:  Simona Huh, M.D.   CLINICAL HISTORY:  Ms. [name] is 77 years old and had bypass surgery in  February of this year after having symptoms of chest pain and dizziness.  She has been involved in rehab since that time.  Over the weekend she  developed symptoms of dizziness which she described as a  lightheadedness.  This occurred and just lasted a few seconds when she  turned her head or moved suddenly.  She also has had some chest  tightness, sometimes related to exertion and sometimes not.  This has  been intermittent since her surgery but has been a little more  pronounced this several days and in rehab.  She came in today for  evaluation of these symptoms.   PAST MEDICAL HISTORY:  Significant for:  1. Hyperlipidemia.  2. Hypertension.   CURRENT MEDICATIONS:  Aspirin, glucosamine, Synthroid, metoprolol,  Crestor, Fish Oil and Mavik.   EXAMINATION TODAY:  The blood pressure was 169/92 and the pulse 76 and  regular.  There was no venous distention.  Carotid pulses were full  without bruits.  CHEST:  Clear.  HEART RHYTHM:  Regular.  No murmurs or gallops.  ABDOMEN:  Soft, normal bowel sounds.  There was no hepatosplenomegaly.  Peripheral pulses were full, there was no peripheral edema.   An electrocardiogram showed a right bundle branch block and had not  changed.   IMPRESSION:  1. Chest pain and dizziness of uncertain etiology.  2. Coronary artery disease status post coronary bypass graft surgery      February of 2008.  3. Hyperlipidemia.   RECOMMENDATIONS:  Ms. [name] is quite concerned about her symptoms.  She  says her symptoms of dizziness and chest pain were similar to what she  has prior to her bypass surgery.  Her blood  pressure is elevated today,  which is a new finding for her since she usually runs about 110-115.  I  will plan to increase her metoprolol from 25 once a day to 25 twice a  day and then switch her to Toprol-XL 50 mg a day.  Will arrange for her  to have an exercise rest stress Myoview scan in the next several days.  Have her see Dr. Diona Browner back in followup next week  to follow up on the results of these tests.  I told her she should cut  back on her activity level until we do the stress test.     Bruce R. Juanda Chance, MD, [facility]  Electronically Signed    BRB/MedQ  DD: 05/23/2007  DT: 05/23/2007  Job #: [id number]

## 2011-05-04 NOTE — Assessment & Plan Note (Signed)
[facility]                            CARDIOLOGY OFFICE NOTE   NAME:STEENJamielyn, Petrucci                     MRN:          [id number]  DATE:03/12/2009                            DOB:          1929/01/30    Ms. Thorman returns for followup today.  Please refer to my recent notes  for details.  She has been complaining of vague chest pain and shortness  of breath.  An echocardiogram in February showed normal LV function,  mild left atrial enlargement, and trivial mitral regurgitation.  A  Myoview in February showed no ischemia or infarction and an ejection  fraction of 69%.  We also scheduled her to have a D-dimer due to her  dyspnea, which was normal.  A BMP was also scheduled due to her dyspnea  and it was normal at 77.  Finally, a chest x-ray showed no active lung  disease.  Since I last saw her, she states she feels much better.  She  denies any dyspnea or continuing chest pain.  She states that her  previous symptoms were most likely due to stress.  There is no pedal  edema.   MEDICATIONS:  1. Mavik 1 mg p.o. daily.  2. Fish oil.  3. Glucosamine.  4. Multivitamin.  5. Aspirin 81 mg p.o. daily.  6. Colace.  7. Synthroid.  8. Tums.  9. Toprol 50 mg p.o. daily.  10.Pravachol 40 mg p.o. daily.  11.Biotin.  12.Coenzyme Q.   PHYSICAL EXAMINATION:  VITAL SIGNS:  Blood pressure of 114/58 and her  pulse is 72.  She weighs 156 pounds.  HEENT:  Normal.  NECK:  Supple.  CHEST:  Clear.  CARDIOVASCULAR:  Regular rate.  ABDOMEN:  No tenderness.  EXTREMITIES:  No edema.   DIAGNOSES:  1. Recent chest pain, shortness of breath - these symptoms have      resolved and a workup has been negative.  We will not pursue this      further.  2. Coronary artery disease - she will continue on her aspirin, ACE      inhibitor, and statin.  3. Hyperlipidemia - she will continue on her statin.  4. Hypertension - her blood pressure is controlled on present  medications.  5. History of intolerance to Zocor, Lipitor, and Crestor.  6. Hypothyroidism.  7. Cerebrovascular disease - she needs followup carotid Dopplers in      December 2010.   I will see her back in 6 months.     Madolyn Frieze Jens Som, MD, [facility]  Electronically Signed    BSC/MedQ  DD: 03/12/2009  DT: 03/12/2009  Job #: [id number]   cc:   Lavonda Jumbo, M.D.

## 2011-05-04 NOTE — Discharge Summary (Signed)
[name], [name]              ACCOUNT NO.:  [id number]   MEDICAL RECORD NO.:  [id number]          PATIENT TYPE:  INP   LOCATION:  5155                         FACILITY:  [facility]   PHYSICIAN:  [name], MDDATE OF BIRTH:  [birth year]   DATE OF ADMISSION:  09/21/2007  DATE OF DISCHARGE:  09/23/2007                               DISCHARGE SUMMARY   DISCHARGE DIAGNOSES:  1. Resolved pancreatitis, suspect passed gallstone.  2. Chronic diarrhea, none during hospitalization, outpatient workup      underway.  3. Increased transaminases, improved.  4. Coronary artery disease with history of coronary artery bypass      graft in February 2008.  5. Hypothyroidism, thyroid-stimulating hormone pending  6. Osteoarthritis.  7. Hypertension.  8. Hyperlipidemia, defer to cardiologist and primary care physician      given the abnormal liver function tests at this time.   DISCHARGE MEDICATIONS.:  Same as upon admission.   CONDITION ON DISCHARGE:  Stable.   CONSULTATIONS:  None.   PROCEDURES:  None.   DIET:  Should be low-fat, low-cholesterol.   ACTIVITY:  Ad lib.   FOLLOWUP:  With Dr. Ewing Schlein this Monday as previously scheduled. Follow up  with Dr. Lynelle Doctor in 2 weeks   PERTINENT LABORATORY DATA:  TSH is pending.  Stool studies were ordered,  but the patient has had no diarrhea.  Lipase on admission was greater  than 2000. The following day it was 166.  On the day of discharge is 48.  Complete metabolic panel on admission showed a normal alkaline  phosphatase. AST was 391, ALT 247. At discharge, her AST was 58, ALT  109.  Cardiac markers negative.  Total cholesterol 211, triglycerides  146, LDL 144.  HDL 38, VLDL 29.   SPECIAL STUDIES AND RADIOLOGY:  Abdominal ultrasound showed likely  diffuse fatty liver.  No intrahepatic biliary dilatation or focal  hepatic lesions. Common bile duct normal in caliber. Gallbladder,  unremarkable. Pancreas not well visualized due to overlying  bowel gas.   CT of the abdomen and pelvis showed a normal pancreas. No focal liver  lesions.  Diffuse osteopenia, passive degenerative change and  hypertrophy affecting lower lumbar spine.   HISTORY AND HOSPITAL COURSE:  Please see H&P for complete admission  details. The patient is a 77 year old white female patient of Dr. Lynelle Doctor  who presented to the emergency room with abdominal pain. She had been  having problems with six month's worth of frequent intermittent  diarrhea. Apparently this was being worked up by Dr. Lynelle Doctor, and she was  referred to Dr. Ewing Schlein.  She has an appointment in 2 days.  The week  prior to admission, she had some discomfort in the upper abdomen as well  as nausea.  She saw her primary care physician who ordered lab work, and  there were reportedly abnormal transaminases, and she was to follow up  with primary care physician, but the pain became so great that she  presented to the emergency room. She had normal vital signs and dry  mucous membranes.  She was in no distress. She had some generalized  tenderness, decreased bowel sounds, and was found to have slightly  elevated transaminases and greatly elevated lipase.  She denies heavy  drinking history.  She will drink a glass of wine rarely, and this is  confirmed by family members. She was admitted to the floor, kept n.p.o.,  given supportive care.  Her symptoms improved quickly, and her  ultrasound and CAT scan were unremarkable. The patient was adamant about  eating and on the day of discharge is being given a low-fat diet, and,  if she tolerates this, can be discharged home with further outpatient  workup. Given the laboratories and exam and history, I suspect she did,  in fact, have pancreatitis despite no evidence of this on CAT scan. I  suspect she may have passed a stone and may need eventual referral to  surgery for a laparoscopic cholecystectomy.  She may also have  pancreatic insufficiency as the cause  of her diarrhea.  However, I will  defer further recommendations to Dr. Ewing Schlein and/or Dr. Lynelle Doctor based on her  continued clinical symptoms and further workup. At this time she was  very anxious to go home.   Total time on the day of discharge is a 45 minutes.   I discussed this with the patient's daughter who is a Engineer, civil (consulting).      Kiara L. Lendell Caprice, MD  Electronically Signed     CLS/MEDQ  D:  09/23/2007  T:  09/24/2007  Job:  [id number]   cc:   Lavonda Jumbo, M.D.  Petra Kuba, M.D.  Learta Codding, MD,FACC

## 2011-05-04 NOTE — Assessment & Plan Note (Signed)
[facility]                            CARDIOLOGY OFFICE NOTE   NAME:STEENLowell, Mcgurk                     MRN:          [id number]  DATE:05/06/2008                            DOB:          Feb 13, 1929    REASON FOR VISIT:  Cardiac followup.   HISTORY OF PRESENT ILLNESS:  Ms. Mckeel returns for a routine visit.  She  is doing fairly well overall.  She has occasional sense of shortness of  breath when she walks up hills, but is able to do yard work without  specific limitation and is not experiencing any progressive angina.  Her  electrocardiogram today shows sinus rhythm with an old right bundle  branch block pattern and no acute changes.  She has been tolerating her  medications well.  Today we talked about statin therapy and she is very  reluctant to consider this given previous intolerances to Crestor and  Zocor.   ALLERGIES:  DILANTIN, EPINEPHRINE, VALIUM, CRESTOR intolerance, ZOCOR  intolerance.   PRESENT MEDICATIONS:  1. Mavik 1 mg p.o. daily.  2. Omega 3 supplements 1200 mg p.o. daily.  3. Biotin 5000 mg p.o. daily.  4. Glucosamine chondroitin as directed.  5. Multivitamin 1 daily.  6. Aspirin 81 mg p.o. daily.  7. Colace 100 mg p.o. b.i.d.  8. Synthroid 50 mcg p.o. daily.  9. Tums Extra Strength.  10.Atenolol 50 mg p.o. daily.   REVIEW OF SYSTEMS:  As described in the history of present illness.  No  palpitations or syncope.  No bleeding problems.  Otherwise negative.   PHYSICAL EXAMINATION:  Blood pressure today is 132/68, heart rate is 64,  weight 156 pounds.  The patient is comfortable in no acute distress.  Conjunctivae and lids normal.  Oropharynx is clear.  NECK:  Supple.  No elevated jugular venous pressure or loud bruits.  No  thyromegaly was noted.  LUNGS:  Clear without labored breathing at rest.  CARDIAC:  Regular rate and rhythm.  No loud murmur or gallop.  ABDOMEN:  Soft, nontender, normoactive bowel sounds.  EXTREMITIES:  No pitting edema.  Distal pulses are 2+.  Skin is warm and  dry.    MUSCULOSKELETAL:  No kyphosis noted.  NEUROPSYCHIATRIC:  The patient alert and oriented x3.  Affect is normal.   IMPRESSION/RECOMMENDATIONS:  1. Cardiovascular disease status post coronary bypass grafting in      February of 2008.  Followup Myoview in June of last year was      reassuring.  The patient is symptomatically stable at this time.      We will plan to switch her atenolol back to Toprol XL now that it      is available.  She will continue with her regular walking regimen.  2. Hyperlipidemia, presently not on statin therapy.  Plan to obtain a      fasting lipid profile and liver function tests.  I can speak with      her more about this along the way.  She is hesitant to consider a      trial of a different statin medication.  Jonelle Sidle, MD  Electronically Signed    SGM/MedQ  DD: 05/06/2008  DT: 05/06/2008  Job #: [id number]   cc:   Lavonda Jumbo, M.D.

## 2011-05-04 NOTE — Assessment & Plan Note (Signed)
[facility]                            CARDIOLOGY OFFICE NOTE   NAME:[name], [name]                     MRN:          [id number]  DATE:02/04/2009                            DOB:          Jun 11, 1929    [name] is a very pleasant 77 year old female who has a history of  coronary artery disease status post coronary bypass and graft in  February 2008.  I last saw her on October 28, 2008.  The patient  presents with multiple complaints today.  She is having chest pain,  which is new since December.  It is in the right chest area just lateral  to her sternotomy.  It is described as someone sitting on my chest.  It can radiate to her right upper extremity.  It has been continuous  since December without ever completely resolving.  It is not pleuritic  or positional nor is it related to food.  It is not exertional.  Because  of the above, she wish to be seen.  She also notes dyspnea on exertion  when climbing stairs, but this is a chronic issue.  There is no  orthopnea, PND, or pedal edema.  She also states that she has had a  headache.  She also states that there was a day approximately 1 week ago  when she developed diaphoresis and hand trembling.   MEDICATIONS:  1. Mavik 1 mg p.o. daily.  2. Fish oil.  3. Glucosamine.  4. Multivitamin.  5. Aspirin 81 mg p.o. daily.  6. Colace.  7. Synthroid 0.5 mg p.o. daily.  8. Tums.  9. Toprol 50 mg p.o. daily.  10.Pravachol 40 mg p.o. daily.  11.Biotin.  12.Coenzyme Q.   PHYSICAL EXAMINATION:  VITAL SIGNS:  Today shows a blood pressure of  118/70, pulse is 89.  She weighs 158 pounds.  HEENT:  Normal.  NECK:  Supple.  CHEST:  Clear.  CARDIOVASCULAR:  Regular rate and rhythm.  ABDOMEN:  No tenderness.  EXTREMITIES:  Show no edema.   Her electrocardiogram shows a sinus rhythm at a rate of 89.  There is a  first-degree AV block.  There is a right bundle-branch block.  This is  unchanged from  previous.   DIAGNOSES:  1. Chest pain - [name]'s symptoms are atypical and may be      musculoskeletal in etiology.  We will plan to proceed with a      Myoview for risk stratification.  If it shows normal perfusion, we      will not pursue further cardiac workup.  2. Dyspnea - we will also plan to repeat her echocardiogram.  3. Coronary artery disease status post coronary bypass and graft - she      will continue on her aspirin, Pravachol, ACE inhibitor, and beta-      blocker.  4. Hypertension - her blood pressure is adequately controlled on      present medications.  5. Hyperlipidemia - she will continue on Pravachol.  6. History of intolerance to ZOCOR, LIPITOR, and CRESTOR.  7. Hypothyroidism.  8. Cerebrovascular disease -  she will need followup carotid Dopplers      performed in December 2010.   I will see her back in 6 weeks to make sure her symptoms have improved.     Madolyn Frieze Jens Som, MD, [facility]  Electronically Signed    BSC/MedQ  DD: 02/04/2009  DT: 02/04/2009  Job #: [id number]   cc:   Lavonda Jumbo, M.D.

## 2011-05-04 NOTE — Assessment & Plan Note (Signed)
[facility]                            CARDIOLOGY OFFICE NOTE   NAME:STEENArtemisa, Sladek                     MRN:          [id number]  DATE:10/05/2007                            DOB:          1928/12/28    PRIMARY CARE PHYSICIAN:  Lavonda Jumbo, M.D.   REASON FOR VISIT:  Cardiac followup.   HISTORY OF PRESENT ILLNESS:  Ms. Finnigan was last in the office back in  July.  I note that she was recently admitted to the hospital in early  October with complaints of abdominal discomfort as well as intermittent  diarrhea.  She was diagnosed ultimately with pancreatitis, possibly  related to transient gallstones, although her workup in the hospital was  fairly reassuring including an abdominal ultrasound and a CT scan of the  abdomen.  She recently underwent a colonoscopy as an outpatient and  tells me that she had some biopsies taken but that overall the results  were reassuring as well.  She reports no recent problems with abdominal  pain or diarrhea, although she has not returned to a completely normal  diet as yet.  Prior feeling of chest heaviness has also improved.  Her  electrocardiogram today is stable showing an old right bundle branch  block pattern and a prolonged PR interval at 224 milliseconds in sinus  rhythm.  She continues on the medications outlined below.   ALLERGIES:  1. DILANTIN.  2. EPINEPHRINE.  3. VALIUM.  4. INTOLERANCE TO CRESTOR.   PRESENT MEDICATIONS:  1. Metoprolol 50 mg p.o. daily.  2. Mavik 1 mg p.o. daily.  3. Omega-3 supplements 1200 mg p.o. daily.  4. Biotin 5000 grams daily.  5. Glucosamine/chondroitin as directed.  6. Multivitamin daily.  7. Aspirin 81 mg p.o. daily.  8. Synthroid 0.75 mg p.o. daily.  9. Colace 100 mg p.o. daily.   REVIEW OF SYSTEMS:  As described in the history of present illness,  otherwise negative.   PHYSICAL EXAMINATION:  VITAL SIGNS:  Blood pressure is 124/68, heart  rate 66, weight is 149  pounds.  GENERAL:  The patient is comfortable and in no acute distress.  NECK:  Reveals no loud bruits.  No elevated jugular venous pressure or  thyromegaly is noted.  LUNGS:  Clear without labored breathing.  CARDIAC:  Reveals a regular rate and rhythm.  No S3, gallop, or  pericardial rub.  Chest wall is well healed.  ABDOMEN:  Soft.  Normoactive bowel sounds.  EXTREMITIES:  Exhibit no pitting edema.   IMPRESSION AND RECOMMENDATIONS:  1. Multi-vessel coronary artery disease, status post coronary artery      bypass grafting in February.  Ejection fraction and followup      Myoview were normal.  Electrocardiogram is stable.  I anticipate      continued medical therapy.  She has completed the cardiac      rehabilitation program and is now exercising at home by walking      either outside or on the treadmill.  I would like to see her back      in the next 6 months for  symptom review.  In terms of risk factor      modification, we will hold off on Statin therapy given her recent      gastrointestinal problems and her prior intolerance to Crestor.      Ultimately when things settle out, we can perhaps try a different      Statin preparation.  She seems most comfortable with this.  2. Otherwise continue regular followup with Dr. Lynelle Doctor and now Dr.      Ewing Schlein.     Jonelle Sidle, MD  Electronically Signed    SGM/MedQ  DD: 10/05/2007  DT: 10/05/2007  Job #: [id number]   cc:   Lavonda Jumbo, M.D.  Petra Kuba, M.D.

## 2011-05-04 NOTE — Op Note (Signed)
NAMELAROSA, RHINES              ACCOUNT NO.:  [id number]   MEDICAL RECORD NO.:  [id number]          PATIENT TYPE:  AMB   LOCATION:  ENDO                         FACILITY:  [facility]   PHYSICIAN:  Petra Kuba, M.D.    DATE OF BIRTH:  [birth year]   DATE OF PROCEDURE:  12/26/2007  DATE OF DISCHARGE:                               OPERATIVE REPORT   PROCEDURE:  Endoscopic retrograde cholangiopancreatography,  sphincterotomy, balloon pull-through.   INDICATIONS:  Presumed common bile duct stones in a patient with  multiple recurrent bouts of pancreatitis, elevated liver tests.  Consent  was signed after risks, benefits, methods, options thoroughly discussed  multiple times in the past.   MEDICINES USED:  100 mcg of fentanyl, 9 mg of Versed.   PROCEDURE:  The side-viewing therapeutic video duodenoscope was inserted  by indirect vision into the stomach, advanced through a normal antrum  and pylorus into the duodenum and a normal appearing ampulla was brought  into view.  Using the triple-lumen sphincterotome loaded with the Jag  wire we initially and advanced the wire twice towards the pancreas.  We  repositioned the sphincterotome and were able to get deep selective  cannulation using the wire.  The CBD was filled, was the upper limits of  normal but did not seem dilated.  No stones were seen.  Minimal  intrahepatics filled and appeared normal.  There was some cystic duct  filling again which looked normal.  We did not fill the gallbladder.  We  went ahead and proceeded with a medium-sized sphincterotomy over the  wire in the customary fashion until adequate biliary drainage was  obtained and we were able to get the fully bowed sphincterotome in and  out of the duct.  We exchanged sphincterotome for the 12-15 mm  adjustable balloon, advanced it to the proximal level of the bifurcation  and proceeded with three balloon pull throughs with the balloon at the  12 mm mark.  The balloon pulled  readily through the sphincterotomy site  without resistance.  No stone, debris or sludge were seen.  We went  ahead and proceeded with an occlusion cholangiogram which appeared  normal except for possibly some tiny air bubbles.  We went ahead and  proceeded with one more balloon pull-through just to make sure the  tinier bubbles were not real and again not seen to be.  No other  abnormalities were seen.  We elected to stop the procedure at this  junction.  There was adequate biliary drainage.  The scope wire and  balloon were removed.  The patient tolerated the procedure well.  There  was no obvious immediate complication.  The scope was removed.  The  patient tolerated the procedure well.  There was no obvious immediate  complication.   ENDOSCOPIC DIAGNOSES:  1. Normal ampulla.  2. Wire advanced towards the pancreas x2 but no pancreatic duct      injections.  3. Normal common bile duct and minimal intrahepatic filling which      appeared normal, normal cystic duct.  4. Status post medium sphincterotomy with three 12  mm balloon pull      throughs.  5. Otherwise negative occlusion cholangiogram.  6. No other abnormalities.   PLAN:  Observe for delayed complications, see how she does.  Consider  surgical consult for empiric cholecystectomy, possible CCK PEPIDA just  follow her clinically and see how this sphincterotomy does with her  symptoms.           ______________________________  Petra Kuba, M.D.     MEM/MEDQ  D:  12/26/2007  T:  12/26/2007  Job:  [id number]   cc:   Jonelle Sidle, MD  [address]  [place], [place] [address]   Lavonda Jumbo, M.D.  Fax: [phone]

## 2011-05-04 NOTE — Assessment & Plan Note (Signed)
[facility]                            CARDIOLOGY OFFICE NOTE   NAME:STEENMarvina, Danner                     MRN:          [id number]  DATE:01/24/2008                            DOB:          01-Jan-1929    REASON FOR VISIT:  Routine cardiology followup.   HISTORY OF PRESENT ILLNESS:  I saw Ms. Harbison back in October.  She has  had problems with pancreatitis and regulation of her thyroid in the  interim, although she really seems to be progressing.  I have received  follow-up notes from Dr. Ewing Schlein regarding the patient's gastroenterology  evaluation.  She seems to be encouraged by things.  From a cardiac  perspective, she is not reporting any angina or progressive  breathlessness, although she has not been able to walk nearly as much  she used to due to a combination of the weather and her other illnesses.  She is trying to get back to a basic walking regimen now.  Her  medications are outlined below.  At the present time, she is almost out  of Toprol XL and this medication is on backorder.  We talked about  switching to atenolol temporarily.  She is not having any problems with  palpitations or syncope.   ALLERGIES:  DILANTIN, EPINEPHRINE, VALIUM AND CRESTOR.   CURRENT MEDICATIONS:  1. Toprol XL 50 mg p.o. daily.  2. Mavik 1 mg p.o. daily.  3. Omega-3 supplements 1200 mg p.o. daily.  4. Biotin 5000 g daily.  5. Glucosamine chondroitin as directed.  6. Multivitamin daily.  7. Aspirin 81 mg p.o. daily.  8. Colace daily.  9. Synthroid 0.5 mcg daily.  10.Tums extra strength.   REVIEW OF SYSTEMS:  As described in the history of present illness.  No  recent abdominal pain, changes in bowel or bladder habits.   PHYSICAL EXAMINATION:  VITAL SIGNS:  Blood pressure today is 126/78,  heart rate is 83, weight 153 pounds up from 149.  GENERAL:  The patient is comfortable in no acute distress.  HEENT:  Conjunctiva is normal.  Oropharynx is clear.  NECK:   Supple.  No loud bruits or thyromegaly noted.  LUNGS:  Clear.  CARDIAC:  Regular rate and rhythm.  No loud murmur or gallop.  ABDOMEN:  Soft, nontender.  Bowel sounds present.  EXTREMITIES:  Exhibit no pitting edema.  Distal pulses are 2+.  SKIN:  Warm and dry.  No scoliosis or kyphosis noted.  NEUROPSYCHIATRIC:  The patient is alert and oriented x3.  Affect is  appropriate.   IMPRESSION/RECOMMENDATIONS:  Coronary artery disease, status post  coronary bypass grafting approximately 1 year ago.  Ejection fraction by  followup Myoview was normal last year without significant ischemia.  The  patient seems to be symptomatically stable from a cardiac perspective  and would anticipate continuing medical therapy with a followup visit in  approximately 6 months.  We are providing a prescription for atenolol 50  mg daily to temporarily replace Toprol XL 50 mg daily while this  medicine is on back order.  Otherwise, we will continue her present  regimen.  I think eventually we will give consideration to rechallenging  with statin therapy, although I would like to give the patient more time  away from her other active gastroenterology issues including  pancreatitis with abnormal liver function tests before we try anything  new.     Jonelle Sidle, MD  Electronically Signed    SGM/MedQ  DD: 01/24/2008  DT: 01/25/2008  Job #: [id number]   cc:   Lavonda Jumbo, M.D.  Petra Kuba, M.D.

## 2011-05-04 NOTE — Assessment & Plan Note (Signed)
[facility]                            CARDIOLOGY OFFICE NOTE   NAME:STEENCandence, Sease                     MRN:          [id number]  DATE:02/19/2009                            DOB:          March 26, 1929    Ms. [name] is a pleasant, 77 year old female with a history of coronary  artery disease, status post coronary bypassing graft in February 2008.  I last saw her on February 04, 2009.  At that time, she was complaining  of chest pain.  We did schedule her to have a Myoview, which was  performed on February 12, 2009.  Her ejection fraction was normal at  69%, and there was no scar or ischemia.  She also had an echocardiogram  performed secondary to dyspnea on February 12, 2009.  Her LV function  was normal.  There was mild left atrial enlargement.  There was trivial  mitral regurgitation.  She presented as an add-on today with continuing  chest pain.  She is very concerned about her symptoms.  The pain is in  the mid to upper chest area.  It is described as a pressure.  It does  not radiate.  It is not pleuritic or positional nor is it related to  food.  It increases with exertion but can also occur at rest.  It has  remained continuous since December without ever completely resolving.  She also has dyspnea on exertion, but there is no orthopnea or PND, and  there is no pedal edema.  She has not had hemoptysis, fevers, chills, or  productive cough.  Note, her pain does not radiate.  Because of her  persistent symptoms, she presented for further evaluation.   PRESENT MEDICATIONS:  1. Mavik 1 mg p.o. daily.  2. Fish oil 1200 mg p.o. daily.  3. Glucosamine.  4. Multivitamin.  5. Aspirin 81 mg p.o. daily.  6. Colace.  7. Synthroid 0.5 mg p.o. daily.  8. Toprol 50 mg p.o. daily.  9. Pravachol 40 mg p.o. daily.  10.Biotin.  11.Coenzyme Q.   PHYSICAL EXAMINATION:  VITAL SIGNS:  Today shows a blood pressure of  96/54 and pulse of 67.  HEENT:  Normal.  NECK:  Supple.  CHEST:  Clear.  CARDIOVASCULAR:  Regular rhythm.  There is a 1-2/6 systolic murmur at  the apex.  Note, she is somewhat tender over the chest when palpating.  ABDOMEN:  No tenderness.  There is no right upper quadrant tenderness or  epigastric tenderness.  EXTREMITIES:  No edema.   Her electrocardiogram today shows a sinus rhythm at a rate of 66.  There  is right bundle branch block.  It is unchanged compared to previous.   DIAGNOSES:  1. Continuing chest pain - the etiology of Ms. Pendergraft's chest pain is      unclear to me.  Her stress test was normal, and her      electrocardiogram shows no changes.  Note, her chest pain has been      continuous since December without ever completely resolving,      although she does state that it  increases with exertion.  I have      explained her that I do not think this is most likely cardiac, but      we certainly can proceed with cardiac catheterization for      definitive evaluation.  However, she declined at this point as she      is concerned about the possible complications.  She is also      complaining of dyspnea.  I will, therefore, schedule her to have a      D-dimer to screen for pulmonary embolus.  If it is elevated, then      we will schedule her to have a CT scan.  We will also check a chest      x-ray.  2. Dyspnea - she does not appear to be volume overloaded on exam, and      her echocardiogram showed normal left ventricular function.  I will      check a BNP to further assess.  We are also checking a chest x-ray.  3. Hypertension - her blood pressure is adequately controlled on      present medications.  4. Hyperlipidemia - she will continue her statin.  We will check      lipids and liver and adjust as indicated.  5. History of intolerance to ZOCOR, LIPITOR, and CRESTOR.  6. Hypothyroidism - we will check a TSH.  7. Cerebrovascular disease - she will need followup carotid Dopplers      in December 2010.  She  will continue on her aspirin and statin.   Again, I will see her back in 4 weeks.  Her chest pain does not sound GI  in etiology, and it may be musculoskeletal.     Madolyn Frieze. Jens Som, MD, [facility]  Electronically Signed    BSC/MedQ  DD: 02/19/2009  DT: 02/19/2009  Job #: [id number]   cc:   Lavonda Jumbo, M.D.

## 2011-05-07 NOTE — Consult Note (Signed)
NAMECALANDRIA, MULLINGS NO.:  [id number]   MEDICAL RECORD NO.:  [id number]          PATIENT TYPE:  INP   LOCATION:  2034                         FACILITY:  [facility]   PHYSICIAN:  Kerin Perna, M.D.  DATE OF BIRTH:  May 02, 1929   DATE OF CONSULTATION:  DATE OF DISCHARGE:                                 CONSULTATION   PHYSICIAN REQUESTING CONSULTATION:  Randa Evens, M.D.   PRIMARY CARE PHYSICIAN:  Joselyn Arrow, M.D.   PRIMARY CARDIOLOGIST:  Lewayne Bunting, M.D.   CONSULTANT:  Kerin Perna, M.D.   REASON FOR CONSULTATION:  Class IV unstable angina, severe coronary  artery disease, chief complaint of chest pain.   HISTORY OF PRESENT ILLNESS:  I was evaluated to evaluate this 77-year-  old white hypertensive female for potential surgical coronary  revascularization for recently diagnosed severe coronary artery disease.  The patient was admitted to the hospital on February 13, 2007, with new  onset substernal chest pain which occurred initially with walking but  subsequently at rest.  She presented to the emergency department where  she had nonspecific ST segment changes and cardiac enzymes are normal.  She was placed on beta-blocker and Lovenox and nitroglycerin.  Cardiac  catheterization was performed on February 14, 2007, which demonstrated  severe vessel disease with a 30% left vein stenosis, 70% LAD stenosis,  and 80-90% stenosis of the circumflex, and then 80% stenosis of the  distal right coronary.  LVF was 55% and left ventricular end diastolic  pressure was 19 millimeters of mercury.  Based on her coronary anatomy  and symptoms, she was felt to be a candidate for surgical coronary  revascularization.  The patient denies risk factors including diabetes,  hyperlipidemia, smoking history, or family history of coronary artery  disease.   PAST MEDICAL HISTORY:  1. Hypertension, on medication.  2. Hypothyroidism.  3. Status post appendectomy 50 years ago.  4.  ALLERGY TO DILANTIN WHICH CAUSED MULTI-SYSTEM FAILURE.  5. ALLERGIES TO DILANTIN, EPINEPHRINE, AND VALIUM.   HOME MEDICATIONS:  1. Mavik (trandolapril) 2 mg daily.  2. Synthroid 50 mcg daily.  3. Colace 100 mg daily.  4. Aspirin 81 mg daily.  5. Multivitamin 1 daily.  6. Fish oil 1 capsule daily.  7. Glucosamine chondroitin sulfate 1 tablet daily.   SOCIAL HISTORY:  The patient is single.  She denies any significant  tobacco or alcohol use.  She used to be a Agricultural engineer, worked in  the ICU, and she currently volunteers at [employer].   FAMILY HISTORY:  Parents died of coronary artery disease at a late age.  No family history of diabetes or stroke.   REVIEW OF SYSTEMS:  CONSTITUTIONAL:  Review is negative for fever or  weight loss.  ENT:  Review is negative for active dental problems.  She did have an  abscessed tooth on the right side extracted several months ago.  She  denies any difficulty swallowing.  She denies any history of thoracic  trauma.  She denies any history of abnormal chest x-ray or pulmonary  nodules.  CARDIAC:  Review is  positive for known three-vessel coronary artery  disease.  There is no evidence of valvular disease by cath but a 2D echo  has been ordered and is pending.  VASCULAR:  Review is negative for DVD, claudication, PI.  Her carotid  duplex studies performed today demonstrate normal brachial artery  pressures bilaterally and a left carotid 40-50% stenosis.  This is non-  flow limiting.  GI:  Review is negative for GERD, hepatitis, jaundice, or __________.  UROLOGIC:  Review is negative for UTI or kidney stones.  ENDOCRINE:  Review is positive for hypothyroidism.  Negative for  diabetes.  NEUROLOGIC:  Review is negative for seizure.  HEMATOLOGIC:  Review is negative for bleeding disorder or blood  transfusion.   PHYSICAL EXAM:  VITAL SIGNS:  The patient is 5 feet 1 inch and weighs 70  kg.  Blood pressure is 130/70.  Pulse 80 and  regular.  Temperature 97.9.  GENERAL APPEARANCE:  Pleasant elderly female in her hospital room  following cardiac catheterization 24 hours ago, accompanied by her son.  She is in no distress.  HEENT EXAM:  Normocephalic.  Full EOMs.  Pharynx clear.  NECK:  Without JVD, mass, or carotid bruits.  LYMPHATICS:  Reveal no palpable supraclavicular or cervical adenopathy.  LUNGS:  Clear bilaterally.  There is no thoracic deformity noted.  CARDIAC EXAM:  Reveals a regular rate and rhythm without S3, S4, gallop,  or murmur.  ABDOMINAL EXAM:  Soft, nontender without pulsatile mass.  EXTREMITIES:  Reveal no clubbing, cyanosis, or edema.  VASCULAR EXAM:  Reveals 2+ pulses in all extremities.  NEUROLOGIC EXAM:  Alert and oriented without focal deficit.   LABORATORY DATA:  1. Twelve-lead EKG shows sinus rhythm without ischemic changes.  2. Chest x-ray shows no disease.  3. BUN 18.  Creatinine 1.0.  LFTs normal.  Hematocrit 43.  Platelet      count 300,000.  White count 7.5.  Cardiac enzymes are negative.   IMPRESSION/PLAN:  This pleasant 77 year old hypertensive female has  severe three-vessel coronary artery disease with symptoms of unstable  angina.  She would benefit from surgical revascularization with bypass  grafts planted at the LAD, diagonal, circumflex, OM1, and possibly the  distal circumflex and the distal right coronary circulation.  I have  discussed the details of surgery with the patient as well as her son.  I  reviewed the alternatives to surgical therapy for treatment of her  coronary artery disease.  I reviewed the major aspects of the planned  procedure including the choice of conduit to include internal mammary  artery and endoscopically harvested saphenous vein, the location of the  surgical incisions, the use of general anesthesia, and cardiopulmonary  bypass, and the expected  postoperative hospital recovery.  I reviewed with her the risks as well of this operation  including the risks of MI, CVA, bleeding, blood  transfusion requirement, infection, and death.  After reviewing these  issues she demonstrated her understanding and agreed to proceed with  operation and what I felt was informed consent.      Kerin Perna, M.D.  Electronically Signed     PV/MEDQ  D:  02/15/2007  T:  02/15/2007  Job:  [id number]

## 2011-05-07 NOTE — Assessment & Plan Note (Signed)
[facility]                            CARDIOLOGY OFFICE NOTE   NAME:[name]                     MRN:          [id number]  DATE:04/17/2007                            DOB:          November 01, 1929    REASON FOR VISIT:  Cardiac followup.   HISTORY OF PRESENT ILLNESS:  [name] was seen back in March.  She  continues in cardiac rehabilitation having completed approximately three  weeks.  She overall states that she has done well, but she has had some  episodes of exertional chest discomfort mainly when she is doing  exercises in using her arms.  Otherwise, when she is ambulating or even  walking up steps, she has no significant symptoms.  She states that she  seems to be progressing, although notes that it has been fairly slow  progress.  She is now placed on Crestor given an LDL of 96 and had  normal baseline liver function tests.  Her right leg continues to  improve with previously noted seroma collection proximal to mid right  calf along the line of vein harvesting.  She is now also off of  amiodarone completely and is not complaining of any palpitations.  I  reviewed her medications today, and we talked about continuing through  cardiac rehabilitation and not pushing her other ADLs too aggressively  as yet.  She was planning to do some traveling by plane in approximately  three weeks which I think is reasonable.  I have asked her to continue  to take her medications and get help with her luggage, however.   ALLERGIES:  DILANTIN, VALIUM.   PRESENT MEDICATIONS:  1. Aspirin 81 mg p.o. daily.  2. Multivitamin daily.  3. Glucosamine chondroitin as directed.  4. Synthroid 0.05 mg p.o. daily.  5. Metoprolol 25 mg p.o. daily.  6. Crestor 10 mg p.o. daily.  7. Mega-3 fish oil supplements 1200 mg p.o. daily.  8. Mavik 2 mg p.o. daily.   REVIEW OF SYSTEMS:  As described in the History of Present Illness.   PHYSICAL EXAMINATION:  VITAL SIGNS:   Blood pressure 118/71, heart rate  71, weight 129 pounds.  GENERAL:  The patient is comfortable and in no acute distress.  NECK:  Reveals no elevated jugular venous pressure without bruits.  No  thyromegaly is noted.  LUNGS:  Generally clear.  CARDIAC:  Reveals regular rate and rhythm.  No S3 gallop.  CHEST:  Reveals well-healing midline sternal incision.  No significant  erythema or drainage.  EXTREMITIES:  Legs:  Reveals vein harvest site on the right with  resolving ecchymoses, also venous stasis changes.   IMPRESSION/RECOMMENDATIONS:  1. Multivessel coronary artery disease with preserved ejection      fraction status post coronary artery bypass grafting in February.      [name] seems to be progressing reasonably well, although at a      slow pace.  I have asked her to continue through cardiac      rehabilitation and have not made any specific medication      adjustments today.  I will  plan to see her back over the next three      months for review.  2. Hyperlipidemia with an LDL cholesterol of 96.  Agree with Crestor.      She may even be able to tolerate a lower dose.  We are shooting for      a goal of 70.     Jonelle Sidle, MD  Electronically Signed    SGM/MedQ  DD: 04/17/2007  DT: 04/17/2007  Job #: [id number]

## 2011-05-07 NOTE — H&P (Signed)
NAMEDANELL, VAZQUEZ              ACCOUNT NO.:  [id number]   MEDICAL RECORD NO.:  [id number]          PATIENT TYPE:  EMS   LOCATION:  MAJO                         FACILITY:  [facility]   PHYSICIAN:  Learta Codding, MD,FACC DATE OF BIRTH:  11/28/1929   DATE OF ADMISSION:  02/13/2007  DATE OF DISCHARGE:                              HISTORY & PHYSICAL   PRIMARY CARE PHYSICIAN:  Lavonda Jumbo, M.D.   REASON FOR ADMISSION:  New onset substernal chest pain.   HISTORY OF PRESENT ILLNESS:  Ms. Hogge is a 77 year old female with no  significant prior past medical history.  The patient does have a history  of hypertension for which she is on trandolapril.  She reports episodes  of substernal chest pain that occurred this afternoon.  Reportedly, the  patient was walking her dog this afternoon when she came home and she  felt dizzy and somewhat nauseated.  She also experienced a heavy chest  pressure.  After she lay down for a few minutes, the chest pressure did  not subside and she called her daughter.  Apparently her daughter is a  Engineer, civil (consulting) and advised her to call 911.  The patient stated that she did not  want to do that and ultimately her family called 911 themselves.  The  patient was still having ongoing substernal chest pressure which was  associated with some nausea but no diaphoresis and mild shortness of  breath.  The patient was given en route 2 nitroglycerin with subsequent  resolution of her substernal chest pain.  In the ER currently, she is  asymptomatic and feels comfortable.  Her vital signs are stable.  Of  note, it was on transfer that the patient's systolic blood pressure  was  markedly elevated at 200-mmHg.  It did improve to normal levels after  nitroglycerin.   ALLERGIES:  1. DILANTIN WHICH HAS CAUSED HER RENAL FAILURE.  2. EPINEPHRINE.  3. VALIUM.   SOCIAL HISTORY:  The patient volunteers at the [employer] [facility] [employer].  She  used to be a CNA and worked the ICU.  She does not  smoke, although she  smoked in her teenage years briefly.  Denies any alcohol use.   FAMILY HISTORY:  Notable for her mother died from a myocardial  infarction at 67.  Father died from a myocardial infarction at age 48.  He was a heavy smoker and the patient states that he abused his body.   MEDICATIONS:  Trandolapril 2 mg p.o. every day.   REVIEW OF SYSTEMS:  As per HPI.  The patient denies any fevers or  chills.  No melena or hematochezia.  No dysuria or frequency.  Positive  for dizziness and nausea, also for substernal chest tightness and  shortness of breath as outlined above.  No syncope.  No neurological  symptoms.   PHYSICAL EXAMINATION:  VITAL SIGNS:  Blood pressure 138/72, heart rate  85 beats per minute, temperature is afebrile.  GENERAL:  Well nourished white female somewhat pale-appearing but in no  apparent distress.  HEENT:  Pupils are equal, round, reactive to light.  NECK:  Supple.  Normal carotid upstrokes.  No carotid bruits.  LUNGS: Clear breath sounds bilaterally.  HEART:  Regular rate and rhythm.  Normal S1 S2.  No murmurs, rubs, or  gallops.  ABDOMEN:  Soft, nontender.  No rebound or guarding.  Good bowel sounds.  EXTREMITIES:  No cyanosis, clubbing, or edema.  NEUROLOGIC:  The patient is alert, oriented, grossly nonfocal.   LABORATORY WORK:  Pending.   A 12-lead electrocardiogram:  Normal sinus rhythm with no acute ischemic  changes.   Chest x-ray is pending.   PROBLEM LIST:  1. Substernal chest pain.      a.     Acute coronary syndrome.      b.     Low risk features.  Troponin is pending and negative       electrocardiogram on admission.  2. History of hypertension.  3. History of ARF  with Dilantin.   PLAN:  1. The patient's symptoms of substernal chest pain are consistent with      angina.  However, she appears to have acutely a low risk profile      with a negative electrocardiogram and no recurrent  substernal      chest pain that was  resolved after 2 nitroglycerin.  I told the      patient that I am convinced that she likely has coronary artery      disease as the cause for her chest pain syndrome.  2. We discussed the various options with the patient how to proceed.      In talking with her and her son, they are likely leaning towards      performing catheterization even if her cardiac troponin is      negative.  We will keep the patient NPO for tonight and plan a      possible cardiac catheterization tomorrow.  3. In the interim, the patient has been started on aspirin, Lovenox      0.75 mg/kg q.12 h. and beta-blocker therapy.  If the patient has      recurrence of substernal chest pain, IV nitroglycerin can be      started.      Learta Codding, MD,FACC  Electronically Signed     GED/MEDQ  D:  02/13/2007  T:  02/13/2007  Job:  [id number]   cc:   Lavonda Jumbo, M.D.

## 2011-05-07 NOTE — Cardiovascular Report (Signed)
[name], [name]              ACCOUNT NO.:  [id number]   MEDICAL RECORD NO.:  [id number]          PATIENT TYPE:  INP   LOCATION:  2034                         FACILITY:  [facility]   PHYSICIAN:  Bevelyn Buckles. Bensimhon, MDDATE OF BIRTH:  1929-12-07   DATE OF PROCEDURE:  02/14/2007  DATE OF DISCHARGE:                            CARDIAC CATHETERIZATION   PRIMARY CARE PHYSICIAN:  Dr. Joselyn Arrow in [place].   CARDIOLOGIST:  Dr. Lewayne Bunting.   PATIENT IDENTIFICATION:  [name] is a 77 year old woman with no  significant cardiac risk factors, who experienced some chest pain while  walking.  She was admitted.  Cardiac markers were negative and EKG was  nonacute.  Given her symptoms though, she was referred for cardiac  catheterization.   PROCEDURES PERFORMED:  1. Selective coronary angiography.  2. Left heart catheterization.  3. Left ventriculogram.   DESCRIPTION OF PROCEDURE:  Risks and benefits of catheterization were  explained; consent was signed and placed on the chart.  A 6-French  arterial sheath was placed in the right femoral artery using a modified  Seldinger technique.  Standard catheters including a 6-French JL-4,  Williams right catheter and angled pigtail were used for procedure.  Catheter exchanges were made over a wire.  There were no apparent  complications.   On engaging the left main coronary artery with a 6-French catheter, we  experienced about a 15-mm drop in pressure with ventricularization of  the wave form.  We then switched out for a 4-French catheter.  There was  no gradient with the 4-French catheter.  We injected 200 mcg of IC  nitroglycerin to see if we could relieve what appeared to be some ostial  spasm; this led to a significant vagal response with hypotension and  pressures into the 70s.  She was treated with IV fluid resuscitation and  recovered nicely.  We were then able to complete the catheterization  without any further complications.   FINDINGS:  Central aortic pressure 149/72, mean of 106.  LV pressure  137/7, EDP of 19.  There is no aortic stenosis.   Left main:  There appeared to be an ostial 30% to 40% lesion with  associated catheter spasm.  There was also damping of 15 mm of pressure  with a 6-French catheter.   LAD was a diffusely diseased vessel coursing to the apex and gave off 2  small diagonals.  There was a 40% to 50% stenosis essentially throughout  the entire LAD. There was a 70-80 lesion in the mid LAD.   Left circumflex gave off a large OM-1, a small OM-2, branching OM-3 and  a moderate-sized OM-4.  Once again, there was diffuse disease throughout  the system.  There appeared to be an 80-90% tubular blockage of the  proximal left circumflex and a 60% to 70% blockage in the proximal  portion of the OM-3.   Right coronary artery was the dominant vessel.  Once again, it was small  and diffusely diseased throughout with a 40% to 50% stenosis throughout  with an 80% distally prior to the PDA.  There was a  moderate-sized acute  marginal, a moderate-sized PDA and 3 small posterolaterals.   Left ventriculogram done in the RAO position showed an EF of 55% with no  wall motion abnormalities.  There was a 2+ mitral regurgitation, there  may be some component of catheter-induced regurgitation.   ASSESSMENT:  1. Three-vessel coronary artery disease with small, diffusely diseased      vessels as described above.  2. Eighty to nintey percent stenosis in the proximal left circumflex,      but the vessel appears small and diffusely diseased.  3. A 30% to 40% ostial left main with associated catheter-induced      spasm.  4. Normal left ventricular function with 2+ mitral regurgitation and      elevated filling pressures, likely consistent with diastolic      dysfunction.   PLAN/DISCUSSION:  I will review the left main and left circumflex  stenoses with Dr. Juanda Chance.  At this point, I doubt the left main is a   high-grade lesion.  I do think there is some catheter spasm there.  The  left circumflex vessel appears to be the worst lesion but is probably  not ideal for intervention.  At this point, I favor medical therapy.  However, as above, will review with Dr. Juanda Chance.      Bevelyn Buckles. Bensimhon, MD  Electronically Signed     DRB/MEDQ  D:  02/14/2007  T:  02/15/2007  Job:  [id number]   cc:   Lavonda Jumbo, M.D.  Learta Codding, MD,FACC

## 2011-05-07 NOTE — Assessment & Plan Note (Signed)
[facility]                            CARDIOLOGY OFFICE NOTE   NAME:Hale, Kiara KULKARNI                     MRN:          [id number]  DATE:03/13/2007                            DOB:          07-14-1929    REASON FOR VISIT:  Post hospitalization followup.   HISTORY OF PRESENT ILLNESS:  This is my first meeting with Ms. [name].  She is a pleasant 77 year old woman admitted to the hospital by Dr.  Andee Lineman back in late February with unstable angina.  She underwent  cardiac catheterization by Dr. Gala Romney revealing 3-vessel coronary  artery disease with small, diffusely diseased vessels including an 80% -  90% stenosis in the proximal left circumflex, 2+ mitral regurgitation,  and overall normal left ventricular systolic function.  She was seen by  Dr. Donata Clay in consultation and underwent coronary artery bypass  grafting with a LIMA to the left anterior descending, saphenous vein  graft to the diagonal, saphenous vein graft to the circumflex, and  saphenous vein graft to the right coronary artery.  She did experience  perioperative atrial fibrillation and is now on amiodarone in normal  sinus rhythm.  She has been home for approximately 3 weeks and states  that generally she feels quite well except that she has had right leg  pain.  She states that she initially had a fair degree of firm swelling  involving the right lower leg, stating that it looked like an eggplant  but that it has gradually been improving, although still painful.  She  has a significant amount of ecchymosis and firmness around the site of  vein harvesting.  Otherwise she is not bothered by significant post  surgical chest pain and states that her breathing has been stable.  Her  chest x-ray today shows elevated right hemidiaphragm with residual  pleural fluid.  She continues on amiodarone at 200 mg p.o. b.i.d. and is  in sinus rhythm today with her electrocardiogram showing a right  bundle  branch block pattern.   ALLERGIES:  DILANTIN, EPINEPHRINE, AND VALIUM.  SHE ALSO IS RELATIVELY  SENSITIVE TO NITROGLYCERIN.   PRESENT MEDICATIONS:  1. Simvastatin 20 mg p.o. daily.  2. Metoprolol 25 mg p.o. b.i.d.  3. Amiodarone 200 mg p.o. b.i.d.  4. Mavik 2 mg p.o. daily.  5. Aspirin 81 mg p.o. daily.  6. Multivitamin 1 p.o. daily.  7. Glucosamine chondroitin as directed.  8. Colace.  9. Synthroid 0.05 mg p.o. daily.   REVIEW OF SYSTEMS:  As described in the history of present illness.   PHYSICAL EXAMINATION:  Blood pressure is 108/73, heart rate is 72,  weight is 149 pounds.  The patient is comfortable and in no acute  distress.  HEENT:  Conjunctivae and lids normal, oropharynx is clear.  NECK:  Supple without elevated jugular venous pressure or loud bruits,  no thyromegalies noted.  LUNGS:  Generally clear with somewhat diminished breath sounds at the  right base, a few faint crackles.  CARDIAC:  Reveals a regular rate and rhythm, no loud murmur, S3 gallop,  no pericardial rub.  ABDOMEN:  Soft,  nontender, and normoactive bowel sounds.  EXTREMITIES:  Show no marked pitting edema.  There is resolving but  still fairly extensive ecchymosis involving the right lower leg with an  area of firmness noted distal to the site of vein harvesting.  This area  is tender, not erythematous, not warm to touch.  Distal pulses are  intact.  SKIN:  Warm and dry.  MUSCULOSKELETAL:  No kyphosis is noted.  NEUROPSYCHIATRIC:  Patient alert and oriented x3, affect is normal.   IMPRESSION/RECOMMENDATION:  1. Multi-vessel coronary artery disease with preserved ejection      fraction that is status post coronary artery bypass grafting as      outlined above.  She did have perioperative atrial fibrillation but      is in normal sinus rhythm today and has had no palpitations.  I      reviewed her medications and at this point plan to decrease      amiodarone to 200 mg p.o. daily and  provide a prescription for      Lasix 20 mg p.o. daily with potassium 10 mEq p.o. daily for the      duration of 7 days.  Regarding her right lower extremity we will      also plan lower extremity ultrasound today to exclude deep venous      thrombosis.  Hopefully this is a resolving fluid collection or      superficial thrombosis which could be treated more conservatively.      Looks less like an area of infection however.  She has planned      followup to see Dr. Donata Clay on Friday.  I will otherwise plan to      see her back in one month's time.  2. Further plans to follow.     Jonelle Sidle, MD  Electronically Signed    SGM/MedQ  DD: 03/13/2007  DT: 03/13/2007  Job #: [id number]   cc:   Kerin Perna, M.D.

## 2011-05-07 NOTE — Discharge Summary (Signed)
[name], [name]              ACCOUNT NO.:  [id number]   MEDICAL RECORD NO.:  [id number]          PATIENT TYPE:  INP   LOCATION:  2039                         FACILITY:  [facility]   PHYSICIAN:  Rowe Clack, P.A.-C. DATE OF BIRTH:  1929/03/06   DATE OF ADMISSION:  02/13/2007  DATE OF DISCHARGE:  02/24/2007                               DISCHARGE SUMMARY   HISTORY OF PRESENT ILLNESS:  The patient is a 77 year old female with a  history of hypertension reported episodes on the day of admission of  substernal chest pain.  The patient was walking her dog, and she felt  dizzy as well as somewhat nauseated.  At that time, she also experienced  a heavy chest pressure type sensation.  After she laid down for a few  minutes, the pressure did not subside, and she called her daughter.  Her  daughter who is a nurse advised her to immediately call 911.  The  patient did not wish to do that, and ultimately her family called 911  themselves.  The patient was still having ongoing chest pressure  associated with some nausea and mild shortness of breath.  She was taken  by EMS to the emergency department and given two nitroglycerin with  resolution of her substernal chest pain.  She was felt to require  admission for prompt evaluation and further treatment.   ALLERGIES:  1. DILANTIN HAS CAUSED RENAL FAILURE.  2. EPINEPHRINE.  3. VALIUM.   MEDICATIONS PRIOR TO ADMISSION:  Mavik 2 mg p.o. daily.   Family history, social history, review of symptoms, and physical  examination, please see the history and physical done on admission.   HOSPITAL COURSE:  The patient was admitted with presumptive diagnosis of  acute coronary syndrome.  She was given options to include cardiac  catheterization as part of her evaluation.  She was started on aspirin,  Lovenox, beta blocker, and admitted to the Intensive Care Unit.  The  patient was then taken to the cardiac catheterization laboratory on  February 14, 2007 at which time she was found to have severe three-  vessel coronary artery disease with small diffusely diseased vessels  approximately 40 to 50% throughout.  Additionally, there was an 80 to  90% lesion in the proximal left circumflex.  There was also a 30 to 40%  ostial left main lesion.  Left ventricular function was normal, and she  did have evidence of 2+ mitral regurgitation.  Due to these findings, a  Cardiac Surgical consultation was obtained with Kerin Perna, M.D. to  evaluated the patient and studies and recommended proceeding with  surgical revascularization.   PROCEDURE:  On February 16, 2007, she was taken to the operating room at  which time she underwent the following procedure, coronary artery bypass  grafting x4.  The following grafts were placed:  1. Left internal mammary artery to the LAD.  2. Saphenous vein graft to the posterior descending.  3. Saphenous vein graft to the diagonal.  4. Saphenous vein graft to the circumflex.   The patient tolerated the procedure well and was taken  to the Surgical  Intensive Care Unit in stable condition.   POSTOPERATIVE HOSPITAL COURSE:  The patient has overall done well.  She  is neurologically intact.  She did have intraoperative atrial  fibrillation and was started on amiodarone, and this was continued as a  postoperative patient and has continued to have normal sinus rhythm.  The patient was initially weaned form the ventilator without difficulty.  However, it has been difficult to wean oxygen entirely and may require  home oxygen at the time of discharge.  Currently, this is not decided.  She has responded well to aggressive pulmonary toilet with overall  improvement in activity level and decrease in symptoms of dyspnea on  exertion.  She has also responded well to diuresis for a moderate amount  of postoperative volume overload.   The patient's laboratory values are stable.  Electrolytes were within  normal limits  on February 23, 2007.  She does have a postoperative anemia,  and most recent hemoglobin and hematocrit dated February 21, 2007 is 9.7 and  28.1, respectively.  Incisions are all healing well without evidence of  infection.  She is currently felt to be tentatively stable for discharge  on the morning of February 24, 2007 pending morning round reevaluation.   Medications on discharge at the time of dictation are as follows:  1. Lopressor 25 mg b.i.d.  2. Aspirin enteric coated 325 mg daily.  3. Mavik 2 mg daily.  4. Amiodarone 200 mg b.i.d.  5. Synthroid 50 mcg daily.  6. Multivitamin 1 daily.  7. Glucosamine and Colace daily as directed for pain.  8. Ultram 50 mg 1 to 2 every 6 hours as needed.   INSTRUCTIONS:  The patient received written instructions regarding  medications, activity, diet, wound care, and followup.  Followup  included an appointment to see Dr. Donata Clay in three weeks.  The office  will contact the patient with his appointment.  Additionally, the  patient has been instructed to follow up in two weeks with [place] with  Cardiology and obtain a chest x-ray at that time.   FINAL DIAGNOSIS:  Severe multivessel coronary artery disease as  described above now status post surgical revascularization as described.   Other diagnoses include:  1. Postoperative acute blood loss anemia.  2. Postoperative hypoxemia.  3. Hypertension.  4. Hypothyroidism.  5. Status post appendectomy 50 years ago.  6. History of allergy to Dilantin which caused multisystem failure.  7. Allergies to Dilantin, epinephrine, and Valium.      Rowe Clack, P.A.-C.     Sherryll Burger  D:  02/23/2007  T:  02/24/2007  Job:  [id number]   cc:   Lavonda Jumbo, M.D.  Learta Codding, MD,FACC  Kerin Perna, M.D.

## 2011-05-07 NOTE — Op Note (Signed)
[name], [name]              ACCOUNT NO.:  [id number]   MEDICAL RECORD NO.:  [id number]          PATIENT TYPE:  INP   LOCATION:  2313                         FACILITY:  [facility]   PHYSICIAN:  Kerin Perna, M.D.  DATE OF BIRTH:  11-26-1929   DATE OF PROCEDURE:  02/15/2007  DATE OF DISCHARGE:                               OPERATIVE REPORT   OPERATION:  1. Coronary artery bypass grafting x4 (left internal mammary artery to      left anterior descending, saphenous vein graft to diagonal,      saphenous vein graft to circumflex marginal, saphenous vein graft      to distal right coronary artery).  2. Endoscopic vein harvesting of conduit.   SURGEON:  Kerin Perna, M.D.   ASSISTANT:  Jerold Coombe, P.A.-C.   ANESTHESIA:  General.   PREOPERATIVE DIAGNOSIS:  Class IV unstable angina with severe three  vessel coronary artery disease.   POSTOPERATIVE DIAGNOSIS:  Class IV unstable angina with severe three vessel coronary artery  disease.   INDICATIONS:  The patient is a 77 year old hypertensive female who had  an episode of exertional and resting chest pain and presented to the  emergency department with negative enzymes.  Cardiac catheterization  done by Dr. Gala Romney demonstrated three vessel coronary artery disease  with mild 30-40% stenosis of the left main.  Her LV function was  preserved and she was felt to be a candidate for surgical evaluation  rather than percutaneous intervention.  I discussed the results of the  cardiac cath with the patient and her family and reviewed the  indications and expected benefits of coronary bypass surgery.  I  reviewed the alternatives to surgical therapy as well.  I discussed the  major aspects of the planned operation including the choice of conduit,  location of the surgical incisions, use of general anesthesia and  cardiopulmonary bypass, and the expected postoperative hospital  recovery.  I reviewed the risks of coronary  bypass surgery with the  patient including risks of MI, CVA, bleeding, blood transfusion  requirement, infection, and death.  She understood these implications  for the surgery and agreed to proceed with operation under what I felt  was an informed consent.   OPERATIVE FINDINGS:  Vein was harvested endoscopically from the right  upper and lower leg.  The vein in the left leg was exposed but was small  and not usable.  The mammary artery was a small vessel but had excellent  flow.  The coronaries were small.  The ramus intermediate was too small  to graft.  It did not have a high grade stenosis, however.  The patient  received a unit of platelets after reversal of heparin with protamine  due to persistent coagulopathy.   PROCEDURE:  The patient was brought to operating room and placed on the  operating table where general anesthesia was induced under invasive  hemodynamic monitoring.  The chest, abdomen and legs were prepped with  Betadine and draped as a sterile field.  A sternal incision was made as  the saphenous vein was harvested endoscopically from  the right leg.  The  left internal mammary artery was harvested as a pedicle graft from its  origin at the subclavian vessels.  Heparin was then administered and  pursestrings were placed in the ascending aorta and right atrium.  When  the vein was ready the patient was cannulated and placed on bypass and  the coronaries were dissected and identified for grafting.  Cardioplegia  catheters were placed for both antegrade aortic and retrograde coronary  sinus cardioplegia.  The patient was cooled to 32 degrees.  The mammary  artery and vein grafts were prepared for the distal anastomoses and the  crossclamp was applied.  800 mL of cold blood cardioplegia was delivered  in split doses between the antegrade aortic and retrograde coronary  sinus catheters.  There was a good cardioplegic arrest and the septal  temperature dropped to less than  14 degrees.   The distal coronary anastomoses were then performed.  The first distal  anastomosis was to the distal right coronary.  There is a 1.5-mm vessel  with a proximal 70% stenosis.  A reverse saphenous vein was sewn end-to-  side with running 7-0 Prolene with good flow through the graft.  The  second distal anastomosis was to the diagonal branch of the LAD.  This  is a small 1.4 mm vessel with a proximal 80% stenosis.  A reverse  saphenous vein was sewn end-to-side with running 7-0 Prolene.  There was  good flow through the graft.  Cardioplegia was redosed.  The third  distal anastomosis was to the obtuse marginal branch of the circumflex.  This was a 1.5-mm vessel with a proximal 80% stenosis and a reverse  saphenous vein was sewn end-to-side with running 7-0 Prolene to this  vessel and there was good flow through the graft.  The fourth distal  anastomosis was to the mid portion of the LAD which was a 1.5-mm vessel  and had a proximal 70% stenosis.  The left IMA pedicle was brought  through an opening created in the left lateral pericardium and was  brought onto the LAD and sewn end-to-side with running 8-0 Prolene.  There was good flow through the anastomosis after briefly releasing the  pedicle bulldog on the mammary pedicle.  The bulldog was reapplied and  the pedicle was secured to the epicardium.  Cardioplegia was redosed.   While crossclamp was still in place, three proximal vein anastomoses  were performed on the ascending aorta using a 4 mm punch and running 7-0  Prolene.  Prior to tying the final proximal anastomosis, air was vented  from the coronaries with a dose of retrograde warm blood cardioplegia  (hot shot).  The crossclamp was then removed.   The heart resumed a spontaneous rhythm.  Air was aspirated from the vein  grafts with a 27 gauge needle and the cardioplegia catheters were removed.  The grafts were checked and each had good flow and hemostasis  was  documented at the proximal and distal anastomoses.  The patient was  rewarmed to 37 degrees.  Temporary pacing wires were applied.  When the  patient was rewarmed and reperfused, the lungs re-expanded and the  ventilator was resumed.  The patient was weaned from bypass without  difficulty.  Cardiac output and blood pressure stable.  Protamine was  administered and there was no adverse reaction.   The mediastinum was irrigated with warm irrigation.  The leg incision  was irrigated and closed in a standard fashion.  The  patient received a  unit of platelets at this time for suboptimal coagulation function.  The  superior mediastinal fat was closed over the aorta.  Two mediastinal and  a left pleural chest tube were placed and brought out through separate  incisions.  The sternum was closed with interrupted steel wire.  The  pectoralis fascia was closed with a running #1 Vicryl and the  subcutaneous and skin layers were closed using a running Vicryl.  Sterile dressings were applied and the patient returned to the ICU in  stable condition.  Total bypass time was 130 minutes with a crossclamp  time of 85 minutes.      Kerin Perna, M.D.  Electronically Signed     PV/MEDQ  D:  02/16/2007  T:  02/16/2007  Job:  [id number]   cc:   Bevelyn Buckles. Bensimhon, MD

## 2011-09-09 LAB — CBC
HCT: 40.1
Hemoglobin: 14
MCV: 90.9
RBC: 4.41
WBC: 8.2

## 2011-09-09 LAB — COMPREHENSIVE METABOLIC PANEL
Albumin: 3.7
Alkaline Phosphatase: 60
BUN: 9
CO2: 27
Chloride: 104
GFR calc non Af Amer: 55 — ABNORMAL LOW
Glucose, Bld: 79
Potassium: 3.9
Total Bilirubin: 1.6 — ABNORMAL HIGH

## 2011-09-09 LAB — ABO/RH: ABO/RH(D): A POS

## 2011-09-09 LAB — LIPASE, BLOOD: Lipase: 212 — ABNORMAL HIGH

## 2011-09-30 LAB — COMPREHENSIVE METABOLIC PANEL
ALT: 109 — ABNORMAL HIGH
ALT: 41 — ABNORMAL HIGH
AST: 155 — ABNORMAL HIGH
AST: 391 — ABNORMAL HIGH
Albumin: 3.6
Albumin: 3.9
Albumin: 4
Alkaline Phosphatase: 47
Alkaline Phosphatase: 51
Alkaline Phosphatase: 58
BUN: 14
BUN: 14
CO2: 27
Calcium: 8.8
Chloride: 101
Chloride: 104
Chloride: 109
Creatinine, Ser: 0.95
GFR calc Af Amer: >60
GFR calc Af Amer: >60
GFR calc non Af Amer: 57 — ABNORMAL LOW
Glucose, Bld: 102 — ABNORMAL HIGH
Glucose, Bld: 106 — ABNORMAL HIGH
Potassium: 3.8
Potassium: 4.1
Potassium: 4.1
Sodium: 141
Total Bilirubin: 0.7
Total Bilirubin: 1

## 2011-09-30 LAB — LIPID PANEL: VLDL: 29

## 2011-09-30 LAB — PROTIME-INR
INR: 1
Prothrombin Time: 13

## 2011-09-30 LAB — CBC
HCT: 41.8
HCT: 43.6
Hemoglobin: 14.2
Platelets: 289
Platelets: 299
RBC: 4.72
WBC: 6.8
WBC: 9.7

## 2011-09-30 LAB — APTT: aPTT: 26

## 2011-09-30 LAB — DIFFERENTIAL
Basophils Absolute: 0.1
Basophils Relative: 1
Eosinophils Relative: 1
Monocytes Absolute: 1.1 — ABNORMAL HIGH

## 2011-09-30 LAB — POCT CARDIAC MARKERS
Operator id: 146091
Troponin i, poc: <0.05

## 2011-09-30 LAB — LIPASE, BLOOD
Lipase: 48
Lipase: >2000 — ABNORMAL HIGH

## 2011-09-30 LAB — CROSSMATCH

## 2011-12-30 DIAGNOSIS — H35379 Puckering of macula, unspecified eye: Secondary | ICD-10-CM | POA: Diagnosis not present

## 2011-12-30 DIAGNOSIS — H43819 Vitreous degeneration, unspecified eye: Secondary | ICD-10-CM | POA: Diagnosis not present

## 2012-01-17 ENCOUNTER — Encounter: Payer: Self-pay | Admitting: *Deleted

## 2012-01-20 ENCOUNTER — Encounter: Payer: Self-pay | Admitting: Cardiology

## 2012-01-20 ENCOUNTER — Ambulatory Visit (INDEPENDENT_AMBULATORY_CARE_PROVIDER_SITE_OTHER): Payer: Medicare Other | Admitting: Cardiology

## 2012-01-20 DIAGNOSIS — I2510 Atherosclerotic heart disease of native coronary artery without angina pectoris: Secondary | ICD-10-CM

## 2012-01-20 DIAGNOSIS — I679 Cerebrovascular disease, unspecified: Secondary | ICD-10-CM | POA: Diagnosis not present

## 2012-01-20 DIAGNOSIS — R079 Chest pain, unspecified: Secondary | ICD-10-CM | POA: Diagnosis not present

## 2012-01-20 DIAGNOSIS — I10 Essential (primary) hypertension: Secondary | ICD-10-CM

## 2012-01-20 DIAGNOSIS — E785 Hyperlipidemia, unspecified: Secondary | ICD-10-CM

## 2012-01-20 NOTE — Assessment & Plan Note (Signed)
Continue diet. Refuses statin.

## 2012-01-20 NOTE — Assessment & Plan Note (Signed)
Patient extremely concerned about her chest pain. Her symptoms have been continuous for several months. Electrocardiogram shows no ST changes. Question musculoskeletal component. Arrange Myoview for risk stratification.

## 2012-01-20 NOTE — Progress Notes (Signed)
HPI: Ms. [name] returns for followup today. She had coronary artery bypass and graft in 2008.  An echocardiogram in February 2010 showed normal LV function, mild left atrial enlargement, and trivial mitral regurgitation.  A Myoview in February 2010 showed no ischemia or infarction and an ejection fraction of 69%.  We also scheduled her to have a D-dimer due to her dyspnea, which was normal.  A BNP was also scheduled due to her dyspnea and it was normal at 77.  Finally, a chest x-ray showed no active lung disease. Last carotid Dopplers were performed in December of 2010. There was 0-39% bilateral stenosis. I last saw her in September of 2011. Since then, she describes chest tightness "like an elephant sitting on my chest". The pain has been present since the fall. It has been continuous without completely resolving. She denies dyspnea. Pain is not pleurtic, positional or related to food. She is concerned about friends who have died recently and is stressed out concerning this by her report. Current Outpatient Prescriptions  Medication Sig Dispense Refill  . aspirin 81 MG tablet Take 81 mg by mouth daily.      . cholecalciferol (VITAMIN D) 1000 UNITS tablet Take 1,000 Units by mouth daily.      . Coenzyme Q10 (CO Q 10 PO) Take 1 capsule by mouth daily.      Marland Kitchen docusate sodium (COLACE) 50 MG capsule Take by mouth as needed.      . Glucosamine-Chondroitin-MSM 500-200-150 MG TABS Take 1 tablet by mouth daily.      Marland Kitchen levothyroxine (SYNTHROID, LEVOTHROID) 50 MCG tablet Take 50 mcg by mouth. Five days a week      . levothyroxine (SYNTHROID, LEVOTHROID) 75 MCG tablet Take 75 mcg by mouth. Two days a week      . metoprolol succinate (TOPROL-XL) 50 MG 24 hr tablet Take 50 mg by mouth daily. Take with or immediately following a meal.      . Omega-3 Fatty Acids (FISH OIL PO) Take 2 capsules by mouth daily.      . trandolapril (MAVIK) 1 MG tablet Take 1 mg by mouth daily.         Past Medical History  Diagnosis  Date  . Hypertension   . Hyperlipidemia   . Coronary artery disease     CABG  x4 LIMA to LAD saphenous vein graft to diagonal, saphenous vein graft to circumflex marginal, saphenous vein graft to distal RAD -- Endoscopic vein harvesting of conduit  . Hypothyroidism   . Cerebrovascular disease   . Chronic diarrhea   . Arthritis   . Pancreatitis   . Acute renal failure (ARF)     history of   . Acute cholecystitis     Past Surgical History  Procedure Date  . Cardiac catheterization 02/14/2007    Three-vessel coronary artery disease with small, diffusely diseased vessels -- normal LV function with EF of 55%  . Coronary artery bypass graft 02/15/2007    x4 LIMA to LAD saphenous vein graft to diagonal, saphenous vein graft to circumflex marginal, saphenous vein graft to distal RAD -- Endoscopic vein harvesting of conduit  . Appendectomy   . Hemorrhoid surgery 10/03/2007  . Ercp w/ sphincterotomy and balloon dilation 12/26/2007  . Cholecystectomy, laparoscopic 01/28/2010    Acute cholecystitis.    History   Social History  . Marital Status: Widowed    Spouse Name: N/A    Number of Children: N/A  . Years of Education: N/A  Occupational History  . Not on file.   Social History Main Topics  . Smoking status: Former Games developer  . Smokeless tobacco: Not on file  . Alcohol Use: Not on file  . Drug Use: Not on file  . Sexually Active: Not on file   Other Topics Concern  . Not on file   Social History Narrative  . No narrative on file    ROS: constipation but no fevers or chills, productive cough, hemoptysis, dysphasia, odynophagia, melena, hematochezia, dysuria, hematuria, rash, seizure activity, orthopnea, PND, pedal edema, claudication. Remaining systems are negative.  Physical Exam: Well-developed well-nourished, anxious in no acute distress.  Skin is warm and dry.  HEENT is normal.  Neck is supple. No thyromegaly.  Chest is clear to auscultation with normal expansion.    Cardiovascular exam is regular rate and rhythm.  Abdominal exam nontender or distended. No masses palpated. Extremities show no edema. neuro grossly intact  ECG sinus rhythm at a rate of 72. First degree AV block. Right bundle branch block.

## 2012-01-20 NOTE — Patient Instructions (Signed)
Your physician wants you to follow-up in: 6 MONTHS You will receive a reminder letter in the mail two months in advance. If you don't receive a letter, please call our office to schedule the follow-up appointment.   Your physician has requested that you have en exercise stress myoview. For further information please visit [email or web address]. Please follow instruction sheet, as given.   

## 2012-01-20 NOTE — Assessment & Plan Note (Signed)
Continue aspirin. Refuses statin. 

## 2012-01-20 NOTE — Assessment & Plan Note (Signed)
Continue aspirin. Refuses statin.

## 2012-01-20 NOTE — Assessment & Plan Note (Signed)
Blood pressure controlled. Continue present medications. Potassium and renal function monitored by primary care. 

## 2012-01-24 ENCOUNTER — Ambulatory Visit (HOSPITAL_COMMUNITY): Payer: Medicare Other | Attending: Cardiology | Admitting: Radiology

## 2012-01-24 DIAGNOSIS — E785 Hyperlipidemia, unspecified: Secondary | ICD-10-CM | POA: Diagnosis not present

## 2012-01-24 DIAGNOSIS — Z951 Presence of aortocoronary bypass graft: Secondary | ICD-10-CM | POA: Diagnosis not present

## 2012-01-24 DIAGNOSIS — R0609 Other forms of dyspnea: Secondary | ICD-10-CM | POA: Diagnosis not present

## 2012-01-24 DIAGNOSIS — I10 Essential (primary) hypertension: Secondary | ICD-10-CM | POA: Insufficient documentation

## 2012-01-24 DIAGNOSIS — I2510 Atherosclerotic heart disease of native coronary artery without angina pectoris: Secondary | ICD-10-CM | POA: Insufficient documentation

## 2012-01-24 DIAGNOSIS — I779 Disorder of arteries and arterioles, unspecified: Secondary | ICD-10-CM | POA: Diagnosis not present

## 2012-01-24 DIAGNOSIS — R079 Chest pain, unspecified: Secondary | ICD-10-CM | POA: Insufficient documentation

## 2012-01-24 DIAGNOSIS — Z8249 Family history of ischemic heart disease and other diseases of the circulatory system: Secondary | ICD-10-CM | POA: Diagnosis not present

## 2012-01-24 DIAGNOSIS — R0602 Shortness of breath: Secondary | ICD-10-CM | POA: Diagnosis not present

## 2012-01-24 DIAGNOSIS — I4510 Unspecified right bundle-branch block: Secondary | ICD-10-CM | POA: Diagnosis not present

## 2012-01-24 DIAGNOSIS — R0989 Other specified symptoms and signs involving the circulatory and respiratory systems: Secondary | ICD-10-CM | POA: Insufficient documentation

## 2012-01-24 MED ORDER — TECHNETIUM TC 99M TETROFOSMIN IV KIT
11.0000 | PACK | Freq: Once | INTRAVENOUS | Status: AC | PRN
  Administered 2012-01-24: 11 via INTRAVENOUS

## 2012-01-24 MED ORDER — TECHNETIUM TC 99M TETROFOSMIN IV KIT
33.0000 | PACK | Freq: Once | INTRAVENOUS | Status: AC | PRN
  Administered 2012-01-24: 33 via INTRAVENOUS

## 2012-01-24 NOTE — Progress Notes (Signed)
[facility] SITE 3 NUCLEAR MED [address] [place] [address] [phone]  Cardiology Nuclear Med Study  [name] is a 78 y.o. female [id number] 15-Mar-1929   Nuclear Med Background Indication for Stress Test:  Evaluation for Ischemia and Graft Patency History:  '08 Heart Catheterization-3-Vessel Disease, EF=55%>2/08 CABG x 4, '10 Echo: EF=55-60%, 2/10 Myocardial Perfusion Study (-) ischemia, EF=69% Cardiac Risk Factors: Carotid Disease, Family History - CAD, Hypertension, Lipids and RBBB  Symptoms: Chest Pain/pressure and tightness with/without exertion (continuous x 1 month per patient),  DOE and SOB   Nuclear Pre-Procedure Caffeine/Decaff Intake:  None NPO After: 4:00pm yesterday   Lungs:  Clear IV 0.9% NS with Angio Cath:  24g  IV Site: R Hand, tolerated well IV Started by:  Irean Hong, RN  Chest Size (in):  36 Cup Size: D  Height: 5\' 1"  (1.549 m)  Weight:  156 lb (70.761 kg)  BMI:  Body mass index is 29.48 kg/(m^2). Tech Comments:  Held toprol x 24 hrs    Nuclear Med Study 1 or 2 day study: 1 day  Stress Test Type:  Stress  Reading MD: Olga Millers, MD  Order Authorizing Provider:  Olga Millers, MD  Resting Radionuclide: Technetium 51m Tetrofosmin  Resting Radionuclide Dose: 11.0 mCi   Stress Radionuclide:  Technetium 42m Tetrofosmin  Stress Radionuclide Dose: 33.0 mCi           Stress Protocol Rest HR: 67 Stress HR: 134  Rest BP: 141/73 Stress BP: 190/65  Exercise Time (min): 5:04 METS: 6.6   Predicted Max HR: 140 bpm % Max HR: 95.71 bpm Rate Pressure Product: 21308   Dose of Adenosine (mg):  n/a Dose of Lexiscan: n/a mg  Dose of Atropine (mg): n/a Dose of Dobutamine: n/a mcg/kg/min (at max HR)  Stress Test Technologist: Irean Hong, RN  Nuclear Technologist:  Domenic Polite, CNMT     Rest Procedure:  Myocardial perfusion imaging was performed at rest 45 minutes following the intravenous administration of Technetium  87m Tetrofosmin. Rest ECG: NSR, RBBB with T wave changes  Stress Procedure:  The patient exercised for 5 minutes and 04 seconds,RPE=15.  The patient stopped due to DOE and patient has had continuous chest pain x one month at baseline was 1-2/10 that increased to 5/10 with exercise.  There were no significant ST-T wave changes. There were occasional PAC's. Technetium 25m Tetrofosmin was injected at peak exercise and myocardial perfusion imaging was performed after a brief delay. Stress ECG: No significant ST segment change suggestive of ischemia.  QPS Raw Data Images:  Acquisition technically good; normal left ventricular size. Stress Images:  Normal homogeneous uptake in all areas of the myocardium. Rest Images:  Normal homogeneous uptake in all areas of the myocardium. Subtraction (SDS):  No evidence of ischemia. Transient Ischemic Dilatation (Normal <1.22):  0.89 Lung/Heart Ratio (Normal <0.45):  0.38  Quantitative Gated Spect Images QGS EDV:  45 ml QGS ESV:  9 ml QGS cine images:  NL LV Function; NL Wall Motion QGS EF: 81%  Impression Exercise Capacity:  Fair exercise capacity. BP Response:  Normal blood pressure response. Clinical Symptoms:  There is chest pain. ECG Impression:  No significant ST segment change suggestive of ischemia. Comparison with Prior Nuclear Study: No significant change from previous study  Overall Impression:  Normal stress nuclear study.  Olga Millers

## 2012-02-03 DIAGNOSIS — I25810 Atherosclerosis of coronary artery bypass graft(s) without angina pectoris: Secondary | ICD-10-CM | POA: Diagnosis not present

## 2012-02-03 DIAGNOSIS — R0789 Other chest pain: Secondary | ICD-10-CM | POA: Diagnosis not present

## 2012-02-03 DIAGNOSIS — R7301 Impaired fasting glucose: Secondary | ICD-10-CM | POA: Diagnosis not present

## 2012-02-03 DIAGNOSIS — I10 Essential (primary) hypertension: Secondary | ICD-10-CM | POA: Diagnosis not present

## 2012-02-18 ENCOUNTER — Other Ambulatory Visit: Payer: Self-pay | Admitting: Dermatology

## 2012-02-18 DIAGNOSIS — L570 Actinic keratosis: Secondary | ICD-10-CM | POA: Diagnosis not present

## 2012-02-18 DIAGNOSIS — L719 Rosacea, unspecified: Secondary | ICD-10-CM | POA: Diagnosis not present

## 2012-03-01 ENCOUNTER — Other Ambulatory Visit: Payer: Self-pay | Admitting: Cardiology

## 2012-03-30 DIAGNOSIS — L253 Unspecified contact dermatitis due to other chemical products: Secondary | ICD-10-CM | POA: Diagnosis not present

## 2012-03-30 DIAGNOSIS — L719 Rosacea, unspecified: Secondary | ICD-10-CM | POA: Diagnosis not present

## 2012-05-01 DIAGNOSIS — L259 Unspecified contact dermatitis, unspecified cause: Secondary | ICD-10-CM | POA: Diagnosis not present

## 2012-05-23 DIAGNOSIS — H6120 Impacted cerumen, unspecified ear: Secondary | ICD-10-CM | POA: Diagnosis not present

## 2012-06-13 DIAGNOSIS — M159 Polyosteoarthritis, unspecified: Secondary | ICD-10-CM | POA: Diagnosis not present

## 2012-06-13 DIAGNOSIS — I10 Essential (primary) hypertension: Secondary | ICD-10-CM | POA: Diagnosis not present

## 2012-06-13 DIAGNOSIS — M542 Cervicalgia: Secondary | ICD-10-CM | POA: Diagnosis not present

## 2012-06-13 DIAGNOSIS — R209 Unspecified disturbances of skin sensation: Secondary | ICD-10-CM | POA: Diagnosis not present

## 2012-06-14 DIAGNOSIS — M47812 Spondylosis without myelopathy or radiculopathy, cervical region: Secondary | ICD-10-CM | POA: Diagnosis not present

## 2012-06-14 DIAGNOSIS — M5020 Other cervical disc displacement, unspecified cervical region: Secondary | ICD-10-CM | POA: Diagnosis not present

## 2012-06-21 ENCOUNTER — Ambulatory Visit: Payer: Medicare Other | Admitting: Cardiology

## 2012-07-03 DIAGNOSIS — L259 Unspecified contact dermatitis, unspecified cause: Secondary | ICD-10-CM | POA: Diagnosis not present

## 2012-07-25 DIAGNOSIS — M542 Cervicalgia: Secondary | ICD-10-CM | POA: Diagnosis not present

## 2012-07-25 DIAGNOSIS — M5412 Radiculopathy, cervical region: Secondary | ICD-10-CM | POA: Diagnosis not present

## 2012-07-25 DIAGNOSIS — M5030 Other cervical disc degeneration, unspecified cervical region: Secondary | ICD-10-CM | POA: Diagnosis not present

## 2012-07-25 DIAGNOSIS — M47812 Spondylosis without myelopathy or radiculopathy, cervical region: Secondary | ICD-10-CM | POA: Diagnosis not present

## 2012-07-25 DIAGNOSIS — G5600 Carpal tunnel syndrome, unspecified upper limb: Secondary | ICD-10-CM | POA: Diagnosis not present

## 2012-08-04 DIAGNOSIS — M4802 Spinal stenosis, cervical region: Secondary | ICD-10-CM | POA: Diagnosis not present

## 2012-08-04 DIAGNOSIS — G5600 Carpal tunnel syndrome, unspecified upper limb: Secondary | ICD-10-CM | POA: Diagnosis not present

## 2012-08-15 DIAGNOSIS — M47812 Spondylosis without myelopathy or radiculopathy, cervical region: Secondary | ICD-10-CM | POA: Diagnosis not present

## 2012-08-15 DIAGNOSIS — M5030 Other cervical disc degeneration, unspecified cervical region: Secondary | ICD-10-CM | POA: Diagnosis not present

## 2012-08-15 DIAGNOSIS — G5600 Carpal tunnel syndrome, unspecified upper limb: Secondary | ICD-10-CM | POA: Diagnosis not present

## 2012-08-15 DIAGNOSIS — M4802 Spinal stenosis, cervical region: Secondary | ICD-10-CM | POA: Diagnosis not present

## 2012-08-22 DIAGNOSIS — M25569 Pain in unspecified knee: Secondary | ICD-10-CM | POA: Diagnosis not present

## 2012-08-30 DIAGNOSIS — M25579 Pain in unspecified ankle and joints of unspecified foot: Secondary | ICD-10-CM | POA: Diagnosis not present

## 2012-08-30 DIAGNOSIS — M25569 Pain in unspecified knee: Secondary | ICD-10-CM | POA: Diagnosis not present

## 2012-08-30 DIAGNOSIS — IMO0001 Reserved for inherently not codable concepts without codable children: Secondary | ICD-10-CM | POA: Diagnosis not present

## 2012-09-13 DIAGNOSIS — M25569 Pain in unspecified knee: Secondary | ICD-10-CM | POA: Diagnosis not present

## 2012-09-20 DIAGNOSIS — M25569 Pain in unspecified knee: Secondary | ICD-10-CM | POA: Diagnosis not present

## 2012-09-20 DIAGNOSIS — IMO0002 Reserved for concepts with insufficient information to code with codable children: Secondary | ICD-10-CM | POA: Diagnosis not present

## 2012-09-29 DIAGNOSIS — E039 Hypothyroidism, unspecified: Secondary | ICD-10-CM | POA: Diagnosis not present

## 2012-09-29 DIAGNOSIS — R252 Cramp and spasm: Secondary | ICD-10-CM | POA: Diagnosis not present

## 2012-09-29 DIAGNOSIS — Z1331 Encounter for screening for depression: Secondary | ICD-10-CM | POA: Diagnosis not present

## 2012-09-29 DIAGNOSIS — R7301 Impaired fasting glucose: Secondary | ICD-10-CM | POA: Diagnosis not present

## 2012-11-24 DIAGNOSIS — M25579 Pain in unspecified ankle and joints of unspecified foot: Secondary | ICD-10-CM | POA: Diagnosis not present

## 2012-11-24 DIAGNOSIS — S93409A Sprain of unspecified ligament of unspecified ankle, initial encounter: Secondary | ICD-10-CM | POA: Diagnosis not present

## 2012-12-03 ENCOUNTER — Other Ambulatory Visit: Payer: Self-pay | Admitting: Cardiology

## 2012-12-06 DIAGNOSIS — M25569 Pain in unspecified knee: Secondary | ICD-10-CM | POA: Diagnosis not present

## 2012-12-08 ENCOUNTER — Telehealth: Payer: Self-pay | Admitting: Cardiology

## 2012-12-08 DIAGNOSIS — S63509A Unspecified sprain of unspecified wrist, initial encounter: Secondary | ICD-10-CM | POA: Diagnosis not present

## 2012-12-08 NOTE — Telephone Encounter (Signed)
Spoke with pt, follow up appt made for pt for clearance of upcoming knee surgery.

## 2012-12-08 NOTE — Telephone Encounter (Signed)
New Problem:    Patient called in returning your call. Please call back. 

## 2012-12-08 NOTE — Telephone Encounter (Signed)
Rerouted as high priority.  Patient would like a call back before you leave today.

## 2012-12-22 ENCOUNTER — Ambulatory Visit (INDEPENDENT_AMBULATORY_CARE_PROVIDER_SITE_OTHER): Payer: Medicare Other | Admitting: Cardiology

## 2012-12-22 ENCOUNTER — Encounter: Payer: Self-pay | Admitting: Cardiology

## 2012-12-22 VITALS — BP 137/83 | HR 100 | Wt 160.0 lb

## 2012-12-22 DIAGNOSIS — I2510 Atherosclerotic heart disease of native coronary artery without angina pectoris: Secondary | ICD-10-CM

## 2012-12-22 DIAGNOSIS — E785 Hyperlipidemia, unspecified: Secondary | ICD-10-CM | POA: Diagnosis not present

## 2012-12-22 DIAGNOSIS — I679 Cerebrovascular disease, unspecified: Secondary | ICD-10-CM

## 2012-12-22 DIAGNOSIS — Z01810 Encounter for preprocedural cardiovascular examination: Secondary | ICD-10-CM | POA: Insufficient documentation

## 2012-12-22 DIAGNOSIS — I10 Essential (primary) hypertension: Secondary | ICD-10-CM | POA: Diagnosis not present

## 2012-12-22 NOTE — Assessment & Plan Note (Signed)
Continue aspirin 

## 2012-12-22 NOTE — Assessment & Plan Note (Addendum)
No recent chest pain. Last Myoview was normal. No further ischemia evaluation indicated preoperatively. Continue present medications postoperatively.

## 2012-12-22 NOTE — Progress Notes (Signed)
HPI: [name] returns for followup today. She had coronary artery bypass and graft in 2008. An echocardiogram in February 2010 showed normal LV function, mild left atrial enlargement, and trivial mitral regurgitation. Previous D-dimer due to dyspnea was normal. A BNP was also scheduled due to her dyspnea and it was normal at 77. Finally, a chest x-ray showed no active lung disease. A Myoview in February 2013 showed no ischemia or infarction and an ejection fraction of 81%. Last carotid Dopplers were performed in December of 2010. There was 0-39% bilateral stenosis. I last saw her in Jan 2013. Since then, she is scheduled to have arthroscopic right knee surgery for a torn meniscus. She otherwise denies dyspnea, chest pain or syncope. No pedal edema.   Current Outpatient Prescriptions  Medication Sig Dispense Refill  . aspirin 81 MG tablet Take 81 mg by mouth daily.      Marland Kitchen BIOTIN PO Take 1 tablet by mouth daily.      . cholecalciferol (VITAMIN D) 1000 UNITS tablet Take 1,000 Units by mouth daily.      . Coenzyme Q10 (CO Q 10 PO) Take 1 capsule by mouth daily.      Marland Kitchen docusate sodium (COLACE) 50 MG capsule Take by mouth as needed.      . Glucosamine-Chondroitin-MSM 500-200-150 MG TABS Take 1 tablet by mouth daily.      Marland Kitchen levothyroxine (SYNTHROID, LEVOTHROID) 50 MCG tablet Take 50 mcg by mouth. Five days a week      . levothyroxine (SYNTHROID, LEVOTHROID) 75 MCG tablet Take 75 mcg by mouth. Two days a week      . metoprolol succinate (TOPROL-XL) 50 MG 24 hr tablet TAKE 1 TABLET BY MOUTH DAILY  90 tablet  0  . Omega-3 Fatty Acids (FISH OIL PO) Take 2 capsules by mouth daily.      . trandolapril (MAVIK) 1 MG tablet Take 1 mg by mouth daily.         Past Medical History  Diagnosis Date  . Hypertension   . Hyperlipidemia   . Coronary artery disease     CABG  x4 LIMA to LAD saphenous vein graft to diagonal, saphenous vein graft to circumflex marginal, saphenous vein graft to distal RAD --  Endoscopic vein harvesting of conduit  . Hypothyroidism   . Cerebrovascular disease   . Chronic diarrhea   . Arthritis   . Pancreatitis   . Acute renal failure (ARF)     history of   . Acute cholecystitis     Past Surgical History  Procedure Date  . Cardiac catheterization 02/14/2007    Three-vessel coronary artery disease with small, diffusely diseased vessels -- normal LV function with EF of 55%  . Coronary artery bypass graft 02/15/2007    x4 LIMA to LAD saphenous vein graft to diagonal, saphenous vein graft to circumflex marginal, saphenous vein graft to distal RAD -- Endoscopic vein harvesting of conduit  . Appendectomy   . Hemorrhoid surgery 10/03/2007  . Ercp w/ sphincterotomy and balloon dilation 12/26/2007  . Cholecystectomy, laparoscopic 01/28/2010    Acute cholecystitis.    History   Social History  . Marital Status: Widowed    Spouse Name: N/A    Number of Children: N/A  . Years of Education: N/A   Occupational History  . Not on file.   Social History Main Topics  . Smoking status: Former Games developer  . Smokeless tobacco: Not on file  . Alcohol Use: Not on file  .  Drug Use: Not on file  . Sexually Active: Not on file   Other Topics Concern  . Not on file   Social History Narrative  . No narrative on file    ROS: right knee arthralgias but no fevers or chills, productive cough, hemoptysis, dysphasia, odynophagia, melena, hematochezia, dysuria, hematuria, rash, seizure activity, orthopnea, PND, pedal edema, claudication. Remaining systems are negative.  Physical Exam: Well-developed well-nourished in no acute distress.  Skin is warm and dry.  HEENT is normal.  Neck is supple.  Chest is clear to auscultation with normal expansion.  Cardiovascular exam is regular rate and rhythm.  Abdominal exam nontender or distended. No masses palpated. Extremities show no edema. neuro grossly intact  ECG normal sinus rhythm at a rate of 100. First degree AV block.  Right bundle branch block. No ST changes.

## 2012-12-22 NOTE — Assessment & Plan Note (Addendum)
Continue present medications. 

## 2012-12-22 NOTE — Assessment & Plan Note (Signed)
Continue diet. She has declined statins previously.

## 2012-12-22 NOTE — Patient Instructions (Addendum)
Your physician wants you to follow-up in: ONE YEAR WITH DR CRENSHAW You will receive a reminder letter in the mail two months in advance. If you don't receive a letter, please call our office to schedule the follow-up appointment.  

## 2012-12-26 DIAGNOSIS — Z85828 Personal history of other malignant neoplasm of skin: Secondary | ICD-10-CM | POA: Diagnosis not present

## 2012-12-26 DIAGNOSIS — L639 Alopecia areata, unspecified: Secondary | ICD-10-CM | POA: Diagnosis not present

## 2012-12-26 DIAGNOSIS — L408 Other psoriasis: Secondary | ICD-10-CM | POA: Diagnosis not present

## 2013-01-10 DIAGNOSIS — M9420 Chondromalacia, unspecified site: Secondary | ICD-10-CM | POA: Diagnosis not present

## 2013-01-10 DIAGNOSIS — M23305 Other meniscus derangements, unspecified medial meniscus, unspecified knee: Secondary | ICD-10-CM | POA: Diagnosis not present

## 2013-01-10 DIAGNOSIS — M2240 Chondromalacia patellae, unspecified knee: Secondary | ICD-10-CM | POA: Diagnosis not present

## 2013-02-06 DIAGNOSIS — L408 Other psoriasis: Secondary | ICD-10-CM | POA: Diagnosis not present

## 2013-02-06 DIAGNOSIS — L639 Alopecia areata, unspecified: Secondary | ICD-10-CM | POA: Diagnosis not present

## 2013-02-06 DIAGNOSIS — Z85828 Personal history of other malignant neoplasm of skin: Secondary | ICD-10-CM | POA: Diagnosis not present

## 2013-02-24 ENCOUNTER — Other Ambulatory Visit: Payer: Self-pay | Admitting: Cardiology

## 2013-03-15 DIAGNOSIS — Z4789 Encounter for other orthopedic aftercare: Secondary | ICD-10-CM | POA: Diagnosis not present

## 2013-04-16 DIAGNOSIS — L408 Other psoriasis: Secondary | ICD-10-CM | POA: Diagnosis not present

## 2013-04-16 DIAGNOSIS — L570 Actinic keratosis: Secondary | ICD-10-CM | POA: Diagnosis not present

## 2013-04-16 DIAGNOSIS — L80 Vitiligo: Secondary | ICD-10-CM | POA: Diagnosis not present

## 2013-04-16 DIAGNOSIS — L719 Rosacea, unspecified: Secondary | ICD-10-CM | POA: Diagnosis not present

## 2013-04-16 DIAGNOSIS — L659 Nonscarring hair loss, unspecified: Secondary | ICD-10-CM | POA: Diagnosis not present

## 2013-04-18 DIAGNOSIS — R071 Chest pain on breathing: Secondary | ICD-10-CM | POA: Diagnosis not present

## 2013-04-18 DIAGNOSIS — E785 Hyperlipidemia, unspecified: Secondary | ICD-10-CM | POA: Diagnosis not present

## 2013-04-18 DIAGNOSIS — R7301 Impaired fasting glucose: Secondary | ICD-10-CM | POA: Diagnosis not present

## 2013-04-18 DIAGNOSIS — I25810 Atherosclerosis of coronary artery bypass graft(s) without angina pectoris: Secondary | ICD-10-CM | POA: Diagnosis not present

## 2013-04-18 DIAGNOSIS — R51 Headache: Secondary | ICD-10-CM | POA: Diagnosis not present

## 2013-04-18 DIAGNOSIS — I10 Essential (primary) hypertension: Secondary | ICD-10-CM | POA: Diagnosis not present

## 2013-04-18 DIAGNOSIS — L259 Unspecified contact dermatitis, unspecified cause: Secondary | ICD-10-CM | POA: Diagnosis not present

## 2013-04-18 DIAGNOSIS — E039 Hypothyroidism, unspecified: Secondary | ICD-10-CM | POA: Diagnosis not present

## 2013-05-20 HISTORY — PX: KNEE ARTHROSCOPY: SUR90

## 2013-06-04 ENCOUNTER — Other Ambulatory Visit: Payer: Self-pay | Admitting: Cardiology

## 2013-06-18 DIAGNOSIS — R7301 Impaired fasting glucose: Secondary | ICD-10-CM | POA: Diagnosis not present

## 2013-06-18 DIAGNOSIS — IMO0002 Reserved for concepts with insufficient information to code with codable children: Secondary | ICD-10-CM | POA: Diagnosis not present

## 2013-06-18 DIAGNOSIS — Z6827 Body mass index (BMI) 27.0-27.9, adult: Secondary | ICD-10-CM | POA: Diagnosis not present

## 2013-06-18 DIAGNOSIS — M5430 Sciatica, unspecified side: Secondary | ICD-10-CM | POA: Diagnosis not present

## 2013-06-19 DIAGNOSIS — N952 Postmenopausal atrophic vaginitis: Secondary | ICD-10-CM | POA: Diagnosis not present

## 2013-07-19 DIAGNOSIS — L408 Other psoriasis: Secondary | ICD-10-CM | POA: Diagnosis not present

## 2013-07-19 DIAGNOSIS — L570 Actinic keratosis: Secondary | ICD-10-CM | POA: Diagnosis not present

## 2013-07-31 DIAGNOSIS — M5430 Sciatica, unspecified side: Secondary | ICD-10-CM | POA: Diagnosis not present

## 2013-07-31 DIAGNOSIS — M4310 Spondylolisthesis, site unspecified: Secondary | ICD-10-CM | POA: Diagnosis not present

## 2013-07-31 DIAGNOSIS — IMO0001 Reserved for inherently not codable concepts without codable children: Secondary | ICD-10-CM | POA: Diagnosis not present

## 2013-07-31 DIAGNOSIS — M549 Dorsalgia, unspecified: Secondary | ICD-10-CM | POA: Diagnosis not present

## 2013-08-08 DIAGNOSIS — M47817 Spondylosis without myelopathy or radiculopathy, lumbosacral region: Secondary | ICD-10-CM | POA: Diagnosis not present

## 2013-08-08 DIAGNOSIS — M5137 Other intervertebral disc degeneration, lumbosacral region: Secondary | ICD-10-CM | POA: Diagnosis not present

## 2013-08-22 DIAGNOSIS — M545 Low back pain: Secondary | ICD-10-CM | POA: Diagnosis not present

## 2013-08-28 DIAGNOSIS — M545 Low back pain: Secondary | ICD-10-CM | POA: Diagnosis not present

## 2013-09-11 DIAGNOSIS — M533 Sacrococcygeal disorders, not elsewhere classified: Secondary | ICD-10-CM | POA: Diagnosis not present

## 2013-09-12 ENCOUNTER — Other Ambulatory Visit: Payer: Self-pay | Admitting: Cardiology

## 2013-09-25 DIAGNOSIS — M48061 Spinal stenosis, lumbar region without neurogenic claudication: Secondary | ICD-10-CM | POA: Diagnosis not present

## 2013-09-25 DIAGNOSIS — IMO0001 Reserved for inherently not codable concepts without codable children: Secondary | ICD-10-CM | POA: Diagnosis not present

## 2013-09-25 DIAGNOSIS — M8540 Solitary bone cyst, unspecified site: Secondary | ICD-10-CM | POA: Diagnosis not present

## 2013-10-03 DIAGNOSIS — IMO0001 Reserved for inherently not codable concepts without codable children: Secondary | ICD-10-CM | POA: Diagnosis not present

## 2013-10-18 DIAGNOSIS — L408 Other psoriasis: Secondary | ICD-10-CM | POA: Diagnosis not present

## 2013-10-18 DIAGNOSIS — L570 Actinic keratosis: Secondary | ICD-10-CM | POA: Diagnosis not present

## 2013-10-19 DIAGNOSIS — IMO0002 Reserved for concepts with insufficient information to code with codable children: Secondary | ICD-10-CM | POA: Diagnosis not present

## 2013-10-19 DIAGNOSIS — E039 Hypothyroidism, unspecified: Secondary | ICD-10-CM | POA: Diagnosis not present

## 2013-10-19 DIAGNOSIS — I25810 Atherosclerosis of coronary artery bypass graft(s) without angina pectoris: Secondary | ICD-10-CM | POA: Diagnosis not present

## 2013-10-19 DIAGNOSIS — L259 Unspecified contact dermatitis, unspecified cause: Secondary | ICD-10-CM | POA: Diagnosis not present

## 2013-10-19 DIAGNOSIS — E785 Hyperlipidemia, unspecified: Secondary | ICD-10-CM | POA: Diagnosis not present

## 2013-10-19 DIAGNOSIS — Z1331 Encounter for screening for depression: Secondary | ICD-10-CM | POA: Diagnosis not present

## 2013-10-19 DIAGNOSIS — K5900 Constipation, unspecified: Secondary | ICD-10-CM | POA: Diagnosis not present

## 2013-10-19 DIAGNOSIS — I10 Essential (primary) hypertension: Secondary | ICD-10-CM | POA: Diagnosis not present

## 2013-10-19 DIAGNOSIS — R7301 Impaired fasting glucose: Secondary | ICD-10-CM | POA: Diagnosis not present

## 2013-10-22 DIAGNOSIS — I10 Essential (primary) hypertension: Secondary | ICD-10-CM | POA: Diagnosis not present

## 2013-10-22 DIAGNOSIS — R82998 Other abnormal findings in urine: Secondary | ICD-10-CM | POA: Diagnosis not present

## 2013-10-29 DIAGNOSIS — IMO0001 Reserved for inherently not codable concepts without codable children: Secondary | ICD-10-CM | POA: Diagnosis not present

## 2013-11-01 DIAGNOSIS — IMO0001 Reserved for inherently not codable concepts without codable children: Secondary | ICD-10-CM | POA: Diagnosis not present

## 2013-11-19 DIAGNOSIS — M5137 Other intervertebral disc degeneration, lumbosacral region: Secondary | ICD-10-CM | POA: Diagnosis not present

## 2013-11-30 DIAGNOSIS — M5137 Other intervertebral disc degeneration, lumbosacral region: Secondary | ICD-10-CM | POA: Diagnosis not present

## 2013-11-30 DIAGNOSIS — M533 Sacrococcygeal disorders, not elsewhere classified: Secondary | ICD-10-CM | POA: Diagnosis not present

## 2013-12-10 DIAGNOSIS — M533 Sacrococcygeal disorders, not elsewhere classified: Secondary | ICD-10-CM | POA: Diagnosis not present

## 2013-12-10 DIAGNOSIS — IMO0001 Reserved for inherently not codable concepts without codable children: Secondary | ICD-10-CM | POA: Diagnosis not present

## 2013-12-22 DIAGNOSIS — M5137 Other intervertebral disc degeneration, lumbosacral region: Secondary | ICD-10-CM | POA: Diagnosis not present

## 2013-12-27 DIAGNOSIS — L408 Other psoriasis: Secondary | ICD-10-CM | POA: Diagnosis not present

## 2013-12-28 DIAGNOSIS — M5137 Other intervertebral disc degeneration, lumbosacral region: Secondary | ICD-10-CM | POA: Diagnosis not present

## 2013-12-31 ENCOUNTER — Ambulatory Visit (INDEPENDENT_AMBULATORY_CARE_PROVIDER_SITE_OTHER): Payer: Medicare Other | Admitting: Cardiology

## 2013-12-31 ENCOUNTER — Encounter: Payer: Self-pay | Admitting: Cardiology

## 2013-12-31 VITALS — BP 130/86 | HR 84 | Ht 61.0 in | Wt 157.0 lb

## 2013-12-31 DIAGNOSIS — I10 Essential (primary) hypertension: Secondary | ICD-10-CM | POA: Diagnosis not present

## 2013-12-31 DIAGNOSIS — I2510 Atherosclerotic heart disease of native coronary artery without angina pectoris: Secondary | ICD-10-CM | POA: Diagnosis not present

## 2013-12-31 DIAGNOSIS — E785 Hyperlipidemia, unspecified: Secondary | ICD-10-CM | POA: Diagnosis not present

## 2013-12-31 DIAGNOSIS — I679 Cerebrovascular disease, unspecified: Secondary | ICD-10-CM

## 2013-12-31 NOTE — Assessment & Plan Note (Signed)
Continue aspirin. Declines Statins.

## 2013-12-31 NOTE — Assessment & Plan Note (Signed)
Minimal on previous carotid Dopplers.continue aspirin.

## 2013-12-31 NOTE — Patient Instructions (Signed)
Your physician wants you to follow-up in: ONE YEAR WITH DR CRENSHAW You will receive a reminder letter in the mail two months in advance. If you don't receive a letter, please call our office to schedule the follow-up appointment.  

## 2013-12-31 NOTE — Assessment & Plan Note (Signed)
Continue diet.declines statins.

## 2013-12-31 NOTE — Assessment & Plan Note (Signed)
Blood pressure controlled. Continue present medications. 

## 2013-12-31 NOTE — Progress Notes (Signed)
HPI: [name] returns for followup today. She had coronary artery bypass and graft in 2008. An echocardiogram in February 2010 showed normal LV function, mild left atrial enlargement, and trivial mitral regurgitation. Previous D-dimer due to dyspnea was normal. A BNP was also scheduled due to her dyspnea and it was normal at 77. Finally, a chest x-ray showed no active lung disease. A Myoview in February 2013 showed no ischemia or infarction and an ejection fraction of 81%. Last carotid Dopplers were performed in December of 2010. There was 0-39% bilateral stenosis. I last saw her in Jan 2014. Since then, the patient denies any dyspnea on exertion, orthopnea, PND, pedal edema, palpitations, syncope or chest pain.    Current Outpatient Prescriptions  Medication Sig Dispense Refill  . aspirin 81 MG tablet Take 81 mg by mouth daily.      Marland Kitchen BIOTIN PO Take 1 tablet by mouth daily.      . cholecalciferol (VITAMIN D) 1000 UNITS tablet Take 1,000 Units by mouth daily.      . Coenzyme Q10 (CO Q 10 PO) Take 1 capsule by mouth daily.      Marland Kitchen docusate sodium (COLACE) 50 MG capsule Take by mouth as needed.      . fluocinonide cream (LIDEX) 0.05 %       . Glucosamine-Chondroitin-MSM 500-200-150 MG TABS Take 1 tablet by mouth daily.      Marland Kitchen levothyroxine (SYNTHROID, LEVOTHROID) 50 MCG tablet Take 50 mcg by mouth. Five days a week      . levothyroxine (SYNTHROID, LEVOTHROID) 75 MCG tablet Take 75 mcg by mouth. Two days a week      . metoprolol succinate (TOPROL-XL) 50 MG 24 hr tablet TAKE 1 TABLET BY MOUTH DAILY  90 tablet  3  . Omega-3 Fatty Acids (FISH OIL PO) Take 2 capsules by mouth daily.      . trandolapril (MAVIK) 1 MG tablet Take 1 mg by mouth daily.       No current facility-administered medications for this visit.     Past Medical History  Diagnosis Date  . Hypertension   . Hyperlipidemia   . Coronary artery disease     CABG  x4 LIMA to LAD saphenous vein graft to diagonal, saphenous  vein graft to circumflex marginal, saphenous vein graft to distal RAD -- Endoscopic vein harvesting of conduit  . Hypothyroidism   . Cerebrovascular disease   . Chronic diarrhea   . Arthritis   . Pancreatitis   . Acute renal failure (ARF)     history of   . Acute cholecystitis     Past Surgical History  Procedure Laterality Date  . Cardiac catheterization  02/14/2007    Three-vessel coronary artery disease with small, diffusely diseased vessels -- normal LV function with EF of 55%  . Coronary artery bypass graft  02/15/2007    x4 LIMA to LAD saphenous vein graft to diagonal, saphenous vein graft to circumflex marginal, saphenous vein graft to distal RAD -- Endoscopic vein harvesting of conduit  . Appendectomy    . Hemorrhoid surgery  10/03/2007  . Ercp w/ sphincterotomy and balloon dilation  12/26/2007  . Cholecystectomy, laparoscopic  01/28/2010    Acute cholecystitis.    History   Social History  . Marital Status: Widowed    Spouse Name: [name]    Number of Children: [name]  . Years of Education: [name]   Occupational History  . Not on file.   Social History  Main Topics  . Smoking status: Former Research scientist (life sciences)  . Smokeless tobacco: Not on file  . Alcohol Use: Not on file  . Drug Use: Not on file  . Sexual Activity: Not on file   Other Topics Concern  . Not on file   Social History Narrative  . No narrative on file    ROS: pain in left buttocks area from cyst but no fevers or chills, productive cough, hemoptysis, dysphasia, odynophagia, melena, hematochezia, dysuria, hematuria, rash, seizure activity, orthopnea, PND, pedal edema, claudication. Remaining systems are negative.  Physical Exam: Well-developed well-nourished in no acute distress.  Skin is warm and dry.  HEENT is normal.  Neck is supple.  Chest is clear to auscultation with normal expansion.  Cardiovascular exam is regular rate and rhythm.  Abdominal exam nontender or distended. No masses palpated. Extremities  show no edema. neuro grossly intact  ECG sinus rhythm at a rate of 84. Right bundle branch block.

## 2014-01-03 DIAGNOSIS — H353190 Nonexudative age-related macular degeneration, unspecified eye, stage unspecified: Secondary | ICD-10-CM | POA: Diagnosis not present

## 2014-01-03 DIAGNOSIS — H35379 Puckering of macula, unspecified eye: Secondary | ICD-10-CM | POA: Diagnosis not present

## 2014-01-09 DIAGNOSIS — I25810 Atherosclerosis of coronary artery bypass graft(s) without angina pectoris: Secondary | ICD-10-CM | POA: Diagnosis not present

## 2014-01-09 DIAGNOSIS — R109 Unspecified abdominal pain: Secondary | ICD-10-CM | POA: Diagnosis not present

## 2014-01-09 DIAGNOSIS — M5430 Sciatica, unspecified side: Secondary | ICD-10-CM | POA: Diagnosis not present

## 2014-01-09 DIAGNOSIS — I10 Essential (primary) hypertension: Secondary | ICD-10-CM | POA: Diagnosis not present

## 2014-01-09 DIAGNOSIS — Z6828 Body mass index (BMI) 28.0-28.9, adult: Secondary | ICD-10-CM | POA: Diagnosis not present

## 2014-01-09 DIAGNOSIS — M5137 Other intervertebral disc degeneration, lumbosacral region: Secondary | ICD-10-CM | POA: Diagnosis not present

## 2014-01-15 DIAGNOSIS — M5137 Other intervertebral disc degeneration, lumbosacral region: Secondary | ICD-10-CM | POA: Diagnosis not present

## 2014-01-15 DIAGNOSIS — G5700 Lesion of sciatic nerve, unspecified lower limb: Secondary | ICD-10-CM | POA: Diagnosis not present

## 2014-01-18 ENCOUNTER — Telehealth: Payer: Self-pay | Admitting: Cardiology

## 2014-01-18 NOTE — Telephone Encounter (Signed)
New Problem:  Pt states she dropped off a document for surgical clearance. Pt is to have surgery at 01/31/14 [facility] - Dr. Jerene Pitch. Pt states they need her clearance letter today. Pt is also requesting a call back to know the status.

## 2014-01-18 NOTE — Telephone Encounter (Signed)
Spoke with sherri with dr Jerene Pitch, last office note containing clearance faxed to [phone]. Pt made aware.

## 2014-01-18 NOTE — Telephone Encounter (Signed)
F/u    Pt calling about previous message. Need call back today.

## 2014-01-22 ENCOUNTER — Encounter (HOSPITAL_COMMUNITY): Payer: Self-pay | Admitting: Pharmacy Technician

## 2014-01-24 NOTE — Pre-Procedure Instructions (Signed)
[name]  01/24/2014   Your procedure is scheduled on:  Thursday, February 12.  Report to [facility], Main Entrance [facility] "A"at 11:30AM.  Call this number if you have problems the morning of surgery: [phone]   Remember:   Do not eat food or drink liquids after midnight Wednesday.   Take these medicines the morning of surgery with A SIP OF WATER: levothyroxine (SYNTHROID, LEVOTHROID).metoprolol succinate (TOPROL-XL).  Stop taking Herbal medications and naproxen.     Do not wear jewelry, make-up or nail polish.  Do not wear lotions, powders, or perfumes.   Do not shave 48 hours prior to surgery. Men may shave face and neck.  Do not bring valuables to the hospital.  [facility] is not responsible  for any belongings or valuables.               Contacts, dentures or bridgework may not be worn into surgery.  Leave suitcase in the car. After surgery it may be brought to your room.  For patients admitted to the hospital, discharge time is determined by your treatment team.               Patients discharged the day of surgery will not be allowed to drive home.  Name and phone number of your driver: -   Special instructions:Shower with CHG wash (Bactoshield) tonight and again in the am prior to arriving to hospital.               Read over handouts.

## 2014-01-25 ENCOUNTER — Encounter (HOSPITAL_COMMUNITY)
Admission: RE | Admit: 2014-01-25 | Discharge: 2014-01-25 | Disposition: A | Discharge status: Home / Self Care | Payer: Medicare Other | Source: Ambulatory Visit | Attending: Anesthesiology | Admitting: Anesthesiology

## 2014-01-25 ENCOUNTER — Encounter (HOSPITAL_COMMUNITY): Payer: Self-pay

## 2014-01-25 ENCOUNTER — Encounter (HOSPITAL_COMMUNITY)
Admission: RE | Admit: 2014-01-25 | Discharge: 2014-01-25 | Disposition: A | Discharge status: Home / Self Care | Payer: Medicare Other | Source: Ambulatory Visit | Attending: Orthopedic Surgery | Admitting: Orthopedic Surgery

## 2014-01-25 DIAGNOSIS — Z01812 Encounter for preprocedural laboratory examination: Secondary | ICD-10-CM | POA: Diagnosis not present

## 2014-01-25 DIAGNOSIS — J984 Other disorders of lung: Secondary | ICD-10-CM | POA: Diagnosis not present

## 2014-01-25 HISTORY — DX: Endothelial corneal dystrophy, unspecified eye: H18.519

## 2014-01-25 HISTORY — DX: Endothelial corneal dystrophy: H18.51

## 2014-01-25 HISTORY — DX: Adverse effect of unspecified anesthetic, initial encounter: T41.45XA

## 2014-01-25 HISTORY — DX: Other complications of anesthesia, initial encounter: T88.59XA

## 2014-01-25 LAB — CBC
HEMATOCRIT: 43.4 % (ref 36.0–46.0)
HEMOGLOBIN: 15.1 g/dL — AB (ref 12.0–15.0)
MCH: 32 pg (ref 26.0–34.0)
MCHC: 34.8 g/dL (ref 30.0–36.0)
MCV: 91.9 fL (ref 78.0–100.0)
PLATELETS: 277 10*3/uL (ref 150–400)
RBC: 4.72 MIL/uL (ref 3.87–5.11)
RDW: 12.8 % (ref 11.5–15.5)
WBC: 9.9 10*3/uL (ref 4.0–10.5)

## 2014-01-25 LAB — BASIC METABOLIC PANEL
BUN: 17 mg/dL (ref 6–23)
CALCIUM: 9.2 mg/dL (ref 8.4–10.5)
CHLORIDE: 103 meq/L (ref 96–112)
CO2: 26 mEq/L (ref 19–32)
Creatinine, Ser: 0.9 mg/dL (ref 0.50–1.10)
GFR calc Af Amer: 67 mL/min — ABNORMAL LOW (ref >90)
GFR calc non Af Amer: 58 mL/min — ABNORMAL LOW (ref >90)
GLUCOSE: 132 mg/dL — AB (ref 70–99)
POTASSIUM: 4.4 meq/L (ref 3.7–5.3)
SODIUM: 142 meq/L (ref 137–147)

## 2014-01-25 LAB — SURGICAL PCR SCREEN
MRSA, PCR: NEGATIVE
Staphylococcus aureus: NEGATIVE

## 2014-01-25 NOTE — Pre-Procedure Instructions (Addendum)
[name]  01/25/2014   Your procedure is scheduled on:  Thursday, February 12.  Report to [facility], Main Entrance [facility] "A"at 11:30AM.  Call this number if you have problems the morning of surgery: [phone]   Remember:   Do not eat food or drink liquids after midnight Wednesday.   Take these medicines the morning of surgery with A SIP OF WATER: levothyroxine (SYNTHROID, LEVOTHROID).metoprolol succinate (TOPROL-XL).      STOP all herbel meds, nsaids (aleve,naproxen,advil,ibuprofen) 5 days prior to surgery including vitamins, aspirin,biotin, coq10, glucosamine-chondroitin, fish oil     Do not wear jewelry, make-up or nail polish.  Do not wear lotions, powders, or perfumes.   Do not shave 48 hours prior to surgery. Men may shave face and neck.  Do not bring valuables to the hospital.  [facility] is not responsible  for any belongings or valuables.               Contacts, dentures or bridgework may not be worn into surgery.  Leave suitcase in the car. After surgery it may be brought to your room.  For patients admitted to the hospital, discharge time is determined by your treatment team.               Patients discharged the day of surgery will not be allowed to drive home.  Name and phone number of your driver: -    Special Instructions: [place] - Preparing for Surgery  Before surgery, you can play an important role.  Because skin is not sterile, your skin needs to be as free of germs as possible.  You can reduce the number of germs on you skin by washing with CHG (chlorahexidine gluconate) soap before surgery.  CHG is an antiseptic cleaner which kills germs and bonds with the skin to continue killing germs even after washing.  Please DO NOT use if you have an allergy to CHG or antibacterial soaps.  If your skin becomes reddened/irritated stop using the CHG and inform your nurse when you arrive at Short Stay.  Do not shave (including legs and  underarms) for at least 48 hours prior to the first CHG shower.  You may shave your face.  Please follow these instructions carefully:   1.  Shower with CHG Soap the night before surgery and the morning of Surgery.  2.  If you choose to wash your hair, wash your hair first as usual with your normal shampoo.  3.  After you shampoo, rinse your hair and body thoroughly to remove the Shampoo.  4.  Use CHG as you would any other liquid soap.  You can apply chg directly  to the skin and wash gently with scrungie or a clean washcloth.  5.  Apply the CHG Soap to your body ONLY FROM THE NECK DOWN.  Do not use on open wounds or open sores.  Avoid contact with your eyes ears, mouth and genitals (private parts).  Wash genitals (private parts)       with your normal soap.  6.  Wash thoroughly, paying special attention to the area where your surgery will be performed.  7.  Thoroughly rinse your body with warm water from the neck down.  8.  DO NOT shower/wash with your normal soap after using and rinsing off the CHG Soap.  9.  Pat yourself dry with a clean towel.            10.  Wear clean pajamas.  11.  Place clean sheets on your bed the night of your first shower and do not sleep with pets.  Day of Surgery  Do not apply any lotions/deodorants the morning of surgery.  Please wear clean clothes to the hospital/surgery center.Marland Kitchen

## 2014-01-28 DIAGNOSIS — M5137 Other intervertebral disc degeneration, lumbosacral region: Secondary | ICD-10-CM | POA: Diagnosis not present

## 2014-01-30 MED ORDER — ACETAMINOPHEN 10 MG/ML IV SOLN
1000.0000 mg | Freq: Four times a day (QID) | INTRAVENOUS | Status: DC
  Administered 2014-01-31: 1000 mg via INTRAVENOUS

## 2014-01-30 MED ORDER — DEXAMETHASONE SODIUM PHOSPHATE 4 MG/ML IJ SOLN: 4.0000 mg | Freq: Once | INTRAMUSCULAR | Status: DC

## 2014-01-31 ENCOUNTER — Inpatient Hospital Stay (HOSPITAL_COMMUNITY): Payer: Medicare Other

## 2014-01-31 ENCOUNTER — Encounter (HOSPITAL_COMMUNITY)
Admission: RE | Disposition: A | Discharge status: Skilled Nursing Facility | Payer: Self-pay | Source: Ambulatory Visit | Attending: Orthopedic Surgery

## 2014-01-31 ENCOUNTER — Inpatient Hospital Stay (HOSPITAL_COMMUNITY): Payer: Medicare Other | Admitting: Anesthesiology

## 2014-01-31 ENCOUNTER — Inpatient Hospital Stay (HOSPITAL_COMMUNITY)
Admission: RE | Admit: 2014-01-31 | Discharge: 2014-02-03 | DRG: 520 | Disposition: A | Discharge status: Skilled Nursing Facility | Payer: Medicare Other | Source: Ambulatory Visit | Attending: Orthopedic Surgery | Admitting: Orthopedic Surgery

## 2014-01-31 ENCOUNTER — Encounter (HOSPITAL_COMMUNITY): Payer: Medicare Other | Admitting: Vascular Surgery

## 2014-01-31 ENCOUNTER — Encounter (HOSPITAL_COMMUNITY): Payer: Self-pay | Admitting: *Deleted

## 2014-01-31 DIAGNOSIS — E785 Hyperlipidemia, unspecified: Secondary | ICD-10-CM | POA: Diagnosis present

## 2014-01-31 DIAGNOSIS — E039 Hypothyroidism, unspecified: Secondary | ICD-10-CM | POA: Diagnosis not present

## 2014-01-31 DIAGNOSIS — Z951 Presence of aortocoronary bypass graft: Secondary | ICD-10-CM

## 2014-01-31 DIAGNOSIS — M545 Low back pain, unspecified: Secondary | ICD-10-CM | POA: Diagnosis not present

## 2014-01-31 DIAGNOSIS — M4800 Spinal stenosis, site unspecified: Secondary | ICD-10-CM | POA: Diagnosis present

## 2014-01-31 DIAGNOSIS — I679 Cerebrovascular disease, unspecified: Secondary | ICD-10-CM | POA: Diagnosis present

## 2014-01-31 DIAGNOSIS — I2510 Atherosclerotic heart disease of native coronary artery without angina pectoris: Secondary | ICD-10-CM | POA: Diagnosis present

## 2014-01-31 DIAGNOSIS — M48062 Spinal stenosis, lumbar region with neurogenic claudication: Secondary | ICD-10-CM | POA: Diagnosis not present

## 2014-01-31 DIAGNOSIS — M48061 Spinal stenosis, lumbar region without neurogenic claudication: Secondary | ICD-10-CM | POA: Diagnosis not present

## 2014-01-31 DIAGNOSIS — Z7982 Long term (current) use of aspirin: Secondary | ICD-10-CM | POA: Diagnosis not present

## 2014-01-31 DIAGNOSIS — Z4789 Encounter for other orthopedic aftercare: Secondary | ICD-10-CM | POA: Diagnosis not present

## 2014-01-31 DIAGNOSIS — Z9889 Other specified postprocedural states: Secondary | ICD-10-CM | POA: Diagnosis not present

## 2014-01-31 DIAGNOSIS — M7130 Other bursal cyst, unspecified site: Secondary | ICD-10-CM | POA: Diagnosis present

## 2014-01-31 DIAGNOSIS — R279 Unspecified lack of coordination: Secondary | ICD-10-CM | POA: Diagnosis not present

## 2014-01-31 DIAGNOSIS — Z79899 Other long term (current) drug therapy: Secondary | ICD-10-CM

## 2014-01-31 DIAGNOSIS — J189 Pneumonia, unspecified organism: Secondary | ICD-10-CM | POA: Diagnosis not present

## 2014-01-31 DIAGNOSIS — I10 Essential (primary) hypertension: Secondary | ICD-10-CM | POA: Diagnosis present

## 2014-01-31 DIAGNOSIS — M1990 Unspecified osteoarthritis, unspecified site: Secondary | ICD-10-CM | POA: Diagnosis not present

## 2014-01-31 DIAGNOSIS — M6281 Muscle weakness (generalized): Secondary | ICD-10-CM | POA: Diagnosis not present

## 2014-01-31 DIAGNOSIS — M519 Unspecified thoracic, thoracolumbar and lumbosacral intervertebral disc disorder: Secondary | ICD-10-CM | POA: Diagnosis not present

## 2014-01-31 DIAGNOSIS — R269 Unspecified abnormalities of gait and mobility: Secondary | ICD-10-CM | POA: Diagnosis not present

## 2014-01-31 HISTORY — PX: LUMBAR LAMINECTOMY/DECOMPRESSION MICRODISCECTOMY: SHX5026

## 2014-01-31 SURGERY — LUMBAR LAMINECTOMY/DECOMPRESSION MICRODISCECTOMY 2 LEVELS
Anesthesia: General | Site: Spine Lumbar

## 2014-01-31 MED ORDER — DIPHENHYDRAMINE HCL 50 MG/ML IJ SOLN: 12.5000 mg | Freq: Four times a day (QID) | INTRAMUSCULAR | Status: DC | PRN

## 2014-01-31 MED ORDER — VANCOMYCIN HCL 500 MG IV SOLR
INTRAVENOUS | Status: DC | PRN
Start: 2014-01-31 — End: 2014-01-31
  Administered 2014-01-31: 500 mg via TOPICAL

## 2014-01-31 MED ORDER — CEFAZOLIN SODIUM-DEXTROSE 2-3 GM-% IV SOLR
2.0000 g | Freq: Once | INTRAVENOUS | Status: AC
  Administered 2014-01-31: 2 g via INTRAVENOUS
  Filled 2014-01-31: qty 50

## 2014-01-31 MED ORDER — SODIUM CHLORIDE 0.9 % IJ SOLN
3.0000 mL | Freq: Two times a day (BID) | INTRAMUSCULAR | Status: DC
  Administered 2014-02-01 – 2014-02-02 (×3): 3 mL via INTRAVENOUS

## 2014-01-31 MED ORDER — ONDANSETRON HCL 4 MG/2ML IJ SOLN
INTRAMUSCULAR | Status: AC
  Filled 2014-01-31: qty 2

## 2014-01-31 MED ORDER — METOPROLOL SUCCINATE ER 25 MG PO TB24
75.0000 mg | ORAL_TABLET | Freq: Every day | ORAL | Status: DC
  Administered 2014-02-01 – 2014-02-03 (×3): 75 mg via ORAL
  Filled 2014-01-31 (×3): qty 3

## 2014-01-31 MED ORDER — THROMBIN 20000 UNITS EX SOLR
OROMUCOSAL | Status: DC | PRN
  Administered 2014-01-31: 15:00:00 via TOPICAL

## 2014-01-31 MED ORDER — PHENYLEPHRINE HCL 10 MG/ML IJ SOLN
INTRAMUSCULAR | Status: DC | PRN
  Administered 2014-01-31 (×5): 80 ug via INTRAVENOUS

## 2014-01-31 MED ORDER — METOPROLOL SUCCINATE ER 50 MG PO TB24: 50.0000 mg | ORAL_TABLET | Freq: Every day | ORAL | Status: DC

## 2014-01-31 MED ORDER — HEMOSTATIC AGENTS (NO CHARGE) OPTIME
TOPICAL | Status: DC | PRN
  Administered 2014-01-31: 2 via TOPICAL

## 2014-01-31 MED ORDER — DEXAMETHASONE 4 MG PO TABS
4.0000 mg | ORAL_TABLET | Freq: Four times a day (QID) | ORAL | Status: DC
  Administered 2014-02-01 – 2014-02-03 (×10): 4 mg via ORAL
  Filled 2014-01-31 (×15): qty 1

## 2014-01-31 MED ORDER — DEXAMETHASONE SODIUM PHOSPHATE 4 MG/ML IJ SOLN
INTRAMUSCULAR | Status: AC
  Administered 2014-01-31: 4 mg via INTRAVENOUS
  Filled 2014-01-31: qty 1

## 2014-01-31 MED ORDER — NALOXONE HCL 0.4 MG/ML IJ SOLN: 0.4000 mg | INTRAMUSCULAR | Status: DC | PRN

## 2014-01-31 MED ORDER — GLYCOPYRROLATE 0.2 MG/ML IJ SOLN
INTRAMUSCULAR | Status: DC | PRN
  Administered 2014-01-31: 1 mg via INTRAVENOUS

## 2014-01-31 MED ORDER — OXYCODONE HCL 5 MG PO TABS: 10.0000 mg | ORAL_TABLET | ORAL | Status: DC | PRN

## 2014-01-31 MED ORDER — GLYCOPYRROLATE 0.2 MG/ML IJ SOLN
INTRAMUSCULAR | Status: AC
  Filled 2014-01-31: qty 5

## 2014-01-31 MED ORDER — PROPOFOL 10 MG/ML IV BOLUS
INTRAVENOUS | Status: DC | PRN
  Administered 2014-01-31: 100 mg via INTRAVENOUS

## 2014-01-31 MED ORDER — ONDANSETRON HCL 4 MG/2ML IJ SOLN
INTRAMUSCULAR | Status: DC | PRN
  Administered 2014-01-31: 4 mg via INTRAVENOUS

## 2014-01-31 MED ORDER — SODIUM CHLORIDE 0.9 % IV SOLN: 250.0000 mL | INTRAVENOUS | Status: DC

## 2014-01-31 MED ORDER — VANCOMYCIN HCL 500 MG IV SOLR
INTRAVENOUS | Status: AC
  Filled 2014-01-31: qty 500

## 2014-01-31 MED ORDER — THROMBIN 20000 UNITS EX SOLR
CUTANEOUS | Status: AC
  Filled 2014-01-31: qty 20000

## 2014-01-31 MED ORDER — ONDANSETRON HCL 4 MG/2ML IJ SOLN: 4.0000 mg | Freq: Once | INTRAMUSCULAR | Status: DC | PRN

## 2014-01-31 MED ORDER — FENTANYL CITRATE 0.05 MG/ML IJ SOLN
INTRAMUSCULAR | Status: AC
  Filled 2014-01-31: qty 5

## 2014-01-31 MED ORDER — METHOCARBAMOL 100 MG/ML IJ SOLN
500.0000 mg | Freq: Four times a day (QID) | INTRAVENOUS | Status: DC | PRN
  Filled 2014-01-31: qty 5

## 2014-01-31 MED ORDER — DEXAMETHASONE SODIUM PHOSPHATE 4 MG/ML IJ SOLN
4.0000 mg | Freq: Four times a day (QID) | INTRAMUSCULAR | Status: DC
  Administered 2014-02-01: 4 mg via INTRAVENOUS
  Filled 2014-01-31 (×14): qty 1

## 2014-01-31 MED ORDER — PHENOL 1.4 % MT LIQD
1.0000 | OROMUCOSAL | Status: DC | PRN
  Administered 2014-02-02: 1 via OROMUCOSAL
  Filled 2014-01-31: qty 177

## 2014-01-31 MED ORDER — MORPHINE SULFATE (PF) 1 MG/ML IV SOLN
INTRAVENOUS | Status: AC
  Administered 2014-01-31: 5 mg via INTRAVENOUS
  Filled 2014-01-31: qty 25

## 2014-01-31 MED ORDER — SODIUM CHLORIDE 0.9 % IJ SOLN: 3.0000 mL | INTRAMUSCULAR | Status: DC | PRN

## 2014-01-31 MED ORDER — NEOSTIGMINE METHYLSULFATE 1 MG/ML IJ SOLN
INTRAMUSCULAR | Status: AC
  Filled 2014-01-31: qty 10

## 2014-01-31 MED ORDER — MORPHINE SULFATE (PF) 1 MG/ML IV SOLN
INTRAVENOUS | Status: DC
  Administered 2014-01-31: 2 mg via INTRAVENOUS

## 2014-01-31 MED ORDER — ACETAMINOPHEN 10 MG/ML IV SOLN
1000.0000 mg | Freq: Four times a day (QID) | INTRAVENOUS | Status: DC
  Administered 2014-02-01 (×2): 1000 mg via INTRAVENOUS
  Filled 2014-01-31 (×4): qty 100

## 2014-01-31 MED ORDER — SODIUM CHLORIDE 0.9 % IJ SOLN: 9.0000 mL | INTRAMUSCULAR | Status: DC | PRN

## 2014-01-31 MED ORDER — PROPOFOL 10 MG/ML IV BOLUS
INTRAVENOUS | Status: AC
  Filled 2014-01-31: qty 20

## 2014-01-31 MED ORDER — NEOSTIGMINE METHYLSULFATE 1 MG/ML IJ SOLN
INTRAMUSCULAR | Status: DC | PRN
  Administered 2014-01-31: 5 mg via INTRAVENOUS

## 2014-01-31 MED ORDER — PHENYLEPHRINE HCL 10 MG/ML IJ SOLN
10.0000 mg | INTRAMUSCULAR | Status: DC | PRN
  Administered 2014-01-31: 30 ug/min via INTRAVENOUS

## 2014-01-31 MED ORDER — LEVOTHYROXINE SODIUM 50 MCG PO TABS
50.0000 ug | ORAL_TABLET | Freq: Every day | ORAL | Status: DC
  Administered 2014-02-01 – 2014-02-03 (×3): 50 ug via ORAL
  Filled 2014-01-31 (×4): qty 1

## 2014-01-31 MED ORDER — BUPIVACAINE-EPINEPHRINE (PF) 0.25% -1:200000 IJ SOLN
INTRAMUSCULAR | Status: AC
  Filled 2014-01-31: qty 30

## 2014-01-31 MED ORDER — ONDANSETRON HCL 4 MG/2ML IJ SOLN: 4.0000 mg | INTRAMUSCULAR | Status: DC | PRN

## 2014-01-31 MED ORDER — LACTATED RINGERS IV SOLN
INTRAVENOUS | Status: DC
  Administered 2014-01-31: 12:00:00 via INTRAVENOUS

## 2014-01-31 MED ORDER — ACETAMINOPHEN 10 MG/ML IV SOLN
INTRAVENOUS | Status: AC
  Filled 2014-01-31: qty 100

## 2014-01-31 MED ORDER — ARTIFICIAL TEARS OP OINT
TOPICAL_OINTMENT | OPHTHALMIC | Status: DC | PRN
  Administered 2014-01-31: 1 via OPHTHALMIC

## 2014-01-31 MED ORDER — FENTANYL CITRATE 0.05 MG/ML IJ SOLN
INTRAMUSCULAR | Status: DC | PRN
  Administered 2014-01-31 (×3): 50 ug via INTRAVENOUS

## 2014-01-31 MED ORDER — LIDOCAINE HCL (CARDIAC) 20 MG/ML IV SOLN
INTRAVENOUS | Status: DC | PRN
  Administered 2014-01-31: 60 mg via INTRAVENOUS

## 2014-01-31 MED ORDER — 0.9 % SODIUM CHLORIDE (POUR BTL) OPTIME
TOPICAL | Status: DC | PRN
  Administered 2014-01-31: 1000 mL

## 2014-01-31 MED ORDER — ROCURONIUM BROMIDE 100 MG/10ML IV SOLN
INTRAVENOUS | Status: DC | PRN
  Administered 2014-01-31: 10 mg via INTRAVENOUS
  Administered 2014-01-31: 40 mg via INTRAVENOUS

## 2014-01-31 MED ORDER — SUCCINYLCHOLINE CHLORIDE 20 MG/ML IJ SOLN
INTRAMUSCULAR | Status: AC
  Filled 2014-01-31: qty 1

## 2014-01-31 MED ORDER — ARTIFICIAL TEARS OP OINT
TOPICAL_OINTMENT | OPHTHALMIC | Status: AC
  Filled 2014-01-31: qty 3.5

## 2014-01-31 MED ORDER — ONDANSETRON HCL 4 MG/2ML IJ SOLN: 4.0000 mg | Freq: Four times a day (QID) | INTRAMUSCULAR | Status: DC | PRN

## 2014-01-31 MED ORDER — LIDOCAINE HCL (CARDIAC) 20 MG/ML IV SOLN
INTRAVENOUS | Status: AC
  Filled 2014-01-31: qty 5

## 2014-01-31 MED ORDER — FENTANYL CITRATE 0.05 MG/ML IJ SOLN: 25.0000 ug | INTRAMUSCULAR | Status: DC | PRN

## 2014-01-31 MED ORDER — MENTHOL 3 MG MT LOZG: 1.0000 | LOZENGE | OROMUCOSAL | Status: DC | PRN

## 2014-01-31 MED ORDER — DIPHENHYDRAMINE HCL 12.5 MG/5ML PO ELIX: 12.5000 mg | ORAL_SOLUTION | Freq: Four times a day (QID) | ORAL | Status: DC | PRN

## 2014-01-31 MED ORDER — LACTATED RINGERS IV SOLN
INTRAVENOUS | Status: DC
  Administered 2014-01-31: 20:00:00 via INTRAVENOUS

## 2014-01-31 MED ORDER — METHOCARBAMOL 500 MG PO TABS
500.0000 mg | ORAL_TABLET | Freq: Four times a day (QID) | ORAL | Status: DC | PRN
  Administered 2014-02-02 – 2014-02-03 (×2): 500 mg via ORAL
  Filled 2014-01-31 (×2): qty 1

## 2014-01-31 MED ORDER — LACTATED RINGERS IV SOLN
INTRAVENOUS | Status: DC | PRN
  Administered 2014-01-31 (×2): via INTRAVENOUS

## 2014-01-31 MED ORDER — CEFAZOLIN SODIUM 1-5 GM-% IV SOLN
1.0000 g | Freq: Three times a day (TID) | INTRAVENOUS | Status: AC
  Administered 2014-01-31 – 2014-02-01 (×2): 1 g via INTRAVENOUS
  Filled 2014-01-31 (×3): qty 50

## 2014-01-31 SURGICAL SUPPLY — 56 items
BANDAGE GAUZE ELAST BULKY 4 IN (GAUZE/BANDAGES/DRESSINGS) ×3 IMPLANT
BUR EGG ELITE 4.0 (BURR) IMPLANT
BUR EGG ELITE 4.0MM (BURR)
CLOSURE STERI-STRIP 1/2X4 (GAUZE/BANDAGES/DRESSINGS) ×1
CLOSURE WOUND 1/2 X4 (GAUZE/BANDAGES/DRESSINGS) ×1
CLOTH BEACON ORANGE TIMEOUT ST (SAFETY) ×3 IMPLANT
CLSR STERI-STRIP ANTIMIC 1/2X4 (GAUZE/BANDAGES/DRESSINGS) ×2 IMPLANT
CORDS BIPOLAR (ELECTRODE) ×3 IMPLANT
DRAPE C-ARM 42X72 X-RAY (DRAPES) ×3 IMPLANT
DRAPE POUCH INSTRU U-SHP 10X18 (DRAPES) ×3 IMPLANT
DRAPE SURG 17X11 SM STRL (DRAPES) ×3 IMPLANT
DRAPE U-SHAPE 47X51 STRL (DRAPES) ×3 IMPLANT
DRSG MEPILEX BORDER 4X4 (GAUZE/BANDAGES/DRESSINGS) ×3 IMPLANT
DRSG MEPILEX BORDER 4X8 (GAUZE/BANDAGES/DRESSINGS) ×3 IMPLANT
DURAPREP 26ML APPLICATOR (WOUND CARE) ×3 IMPLANT
ELECT BLADE 4.0 EZ CLEAN MEGAD (MISCELLANEOUS) ×3
ELECT CAUTERY BLADE 6.4 (BLADE) ×3 IMPLANT
ELECT REM PT RETURN 9FT ADLT (ELECTROSURGICAL) ×3
ELECTRODE BLDE 4.0 EZ CLN MEGD (MISCELLANEOUS) ×1 IMPLANT
ELECTRODE REM PT RTRN 9FT ADLT (ELECTROSURGICAL) ×1 IMPLANT
FLOSEAL (HEMOSTASIS) IMPLANT
GLOVE BIOGEL PI IND STRL 8 (GLOVE) ×1 IMPLANT
GLOVE BIOGEL PI IND STRL 8.5 (GLOVE) ×1 IMPLANT
GLOVE BIOGEL PI INDICATOR 8 (GLOVE) ×2
GLOVE BIOGEL PI INDICATOR 8.5 (GLOVE) ×2
GLOVE ECLIPSE 8.5 STRL (GLOVE) ×3 IMPLANT
GLOVE ORTHO TXT STRL SZ7.5 (GLOVE) ×3 IMPLANT
GOWN STRL NON-REIN LRG LVL3 (GOWN DISPOSABLE) ×3 IMPLANT
GOWN STRL REIN 2XL XLG LVL4 (GOWN DISPOSABLE) ×3 IMPLANT
GOWN STRL REUS W/TWL 2XL LVL3 (GOWN DISPOSABLE) ×3 IMPLANT
KIT BASIN OR (CUSTOM PROCEDURE TRAY) ×3 IMPLANT
KIT STIMULAN RAPID CURE 5CC (Orthopedic Implant) ×3 IMPLANT
NEEDLE 22X1 1/2 (OR ONLY) (NEEDLE) ×3 IMPLANT
NEEDLE SPNL 18GX3.5 QUINCKE PK (NEEDLE) ×6 IMPLANT
NS IRRIG 1000ML POUR BTL (IV SOLUTION) ×3 IMPLANT
PACK LAMINECTOMY ORTHO (CUSTOM PROCEDURE TRAY) ×3 IMPLANT
PACK UNIVERSAL I (CUSTOM PROCEDURE TRAY) ×3 IMPLANT
PATTIES SURGICAL .5 X.5 (GAUZE/BANDAGES/DRESSINGS) IMPLANT
PATTIES SURGICAL .5 X1 (DISPOSABLE) ×3 IMPLANT
SPONGE LAP 4X18 X RAY DECT (DISPOSABLE) IMPLANT
SPONGE SURGIFOAM ABS GEL 100 (HEMOSTASIS) ×3 IMPLANT
STAPLER VISISTAT 35W (STAPLE) IMPLANT
STRIP CLOSURE SKIN 1/2X4 (GAUZE/BANDAGES/DRESSINGS) ×2 IMPLANT
SURGIFLO TRUKIT (HEMOSTASIS) ×6 IMPLANT
SUT MON AB 3-0 SH 27 (SUTURE) ×2
SUT MON AB 3-0 SH27 (SUTURE) ×1 IMPLANT
SUT VIC AB 1 CT1 27 (SUTURE) ×4
SUT VIC AB 1 CT1 27XBRD ANTBC (SUTURE) ×2 IMPLANT
SUT VIC AB 2-0 CT1 18 (SUTURE) ×6 IMPLANT
SUT VICRYL 0 UR6 27IN ABS (SUTURE) ×3 IMPLANT
SYR BULB IRRIGATION 50ML (SYRINGE) ×3 IMPLANT
SYR CONTROL 10ML LL (SYRINGE) ×3 IMPLANT
TOWEL OR 17X26 10 PK STRL BLUE (TOWEL DISPOSABLE) ×6 IMPLANT
TRAY FOLEY CATH 16FRSI W/METER (SET/KITS/TRAYS/PACK) ×3 IMPLANT
WATER STERILE IRR 1000ML POUR (IV SOLUTION) ×3 IMPLANT
YANKAUER SUCT BULB TIP NO VENT (SUCTIONS) ×3 IMPLANT

## 2014-01-31 NOTE — Anesthesia Preprocedure Evaluation (Addendum)
Anesthesia Evaluation  Patient identified by MRN, date of birth, ID band Patient awake    Reviewed: Allergy & Precautions, H&P , NPO status , Patient's Chart, lab work & pertinent test results, reviewed documented beta blocker date and time   History of Anesthesia Complications Negative for: history of anesthetic complications  Airway Mallampati: II TM Distance: >3 FB Neck ROM: Full    Dental  (+) Teeth Intact, Dental Advisory Given   Pulmonary neg shortness of breath, neg sleep apnea, neg COPDformer smoker,    Pulmonary exam normal       Cardiovascular hypertension, Pt. on medications and Pt. on home beta blockers + CAD and + CABG - CHF - dysrhythmias Rhythm:Regular Rate:Normal     Neuro/Psych Lower back pain with left leg symptoms    GI/Hepatic negative GI ROS, Neg liver ROS,   Endo/Other  neg diabetesHypothyroidism   Renal/GU negative Renal ROS     Musculoskeletal   Abdominal   Peds  Hematology negative hematology ROS (+)   Anesthesia Other Findings   Reproductive/Obstetrics negative OB ROS                          Anesthesia Physical Anesthesia Plan  ASA: III  Anesthesia Plan: General   Post-op Pain Management:    Induction: Intravenous  Airway Management Planned: Oral ETT  Additional Equipment: None  Intra-op Plan:   Post-operative Plan: Extubation in OR  Informed Consent: I have reviewed the patients History and Physical, chart, labs and discussed the procedure including the risks, benefits and alternatives for the proposed anesthesia with the patient or authorized representative who has indicated his/her understanding and acceptance.   Consent reviewed with POA  Plan Discussed with: CRNA and Surgeon  Anesthesia Plan Comments:         Anesthesia Quick Evaluation

## 2014-01-31 NOTE — Transfer of Care (Signed)
Immediate Anesthesia Transfer of Care Note  Patient: [name]  Procedure(s) Performed: Procedure(s): L3-5 DECOMPRESSION/REMOVAL OF FACET CYST (N/A)  Patient Location: PACU  Anesthesia Type:General  Level of Consciousness: awake and alert   Airway & Oxygen Therapy: Patient Spontanous Breathing and Patient connected to nasal cannula oxygen  Post-op Assessment: Report given to PACU RN, Post -op Vital signs reviewed and stable and Patient moving all extremities X 4  Post vital signs: Reviewed and stable  Complications: No apparent anesthesia complications

## 2014-01-31 NOTE — H&P (Signed)
History of Present Illness  The patient is a 80 year old female who presents today for follow up of their back. The patient is being followed for their left-sided (6 days post right piriformis injection which helped until this morning. She continues with left sided pain) buttock pain (She is complaining of pain at the ischial tuberosity on right that is very painfuol to ride in car or sit for very long). They are now from felt a pull in the right hamsrtring. Symptoms reported today include: pain (left low back "where the cyst is" and pain shoots down the left leg), pain at night, aching, pain with weightbearing, difficulty ambulating and leg pain (left). The patient states that they are doing poorly. The following medication has been used for pain control: antiinflammatory medication (Aleve prn). The patient reports their current pain level to be 2 / 10. The patient presents today following piriformis injection on the right. The patient has not gotten any relief of their symptoms with Cortisone injections (SI joint injection). Note for "Follow-up back": Today she is having hamstring pain on the right side.   She continues to have significant back, left gluteal, and left leg pain. She states the numbness is persistent and nothing really thus far has given her any significant relief. The right piriformis injection that was done on 01/09/2014 was very successful in helping that immediate pain that she had. However, she is now back to her baseline level of discomfort with the left side.    Her MRI from 12/22/2013 was reviewed. She does have transitional anatomy with a partially lumbarized S1 vertebra, but using that as S1 and counting up, at L4-L5 she has a large left synovial cyst causing compression of the traversing L5 nerve root and marked spinal stenosis. She has moderate spinal stenosis at L3-L4 level. All the foramen are patent and there is some lateral recess disease at the L3-L4 level.  There is advanced facet disease at L5-S1 with slight anterolisthesis, but there is no significant central or lateral recess stenosis. There is some mild mass effect, but it is not horrific.   Allergies Epinephrine. 03/03/2001 Valium. 03/03/2001 Dilantin. 03/03/2001 Nitroglycerin *ANTIANGINAL AGENTS*    Family History Family history unknown - Adopted    Social History Children. 3 Marital status. widowed Pain Contract. no Exercise. Exercises daily; does running / walking and other Illicit drug use. no Drug/Alcohol Rehab (Currently). no Living situation. live alone Drug/Alcohol Rehab (Previously). no Current work status. retired Alcohol use. never consumed alcohol Tobacco use. Never smoker. never smoker    Medication History Toprol XL (50MG  Tablet ER 24HR, 1 Oral daily) Active. Glucosamine Chondroitin (1 Oral daily) Active. Fish Oil Maximum Strength (1200MG  Capsule DR, 1 Oral daily) Active. Biotin (1000MCG Tablet, 1 Oral daily) Active. Stool Softener (100MG  Tablet, 1 Oral daily) Active. Levothyroxine Sodium (75MCG Tablet, Oral) Active. Levothyroxine Sodium (50MCG Tablet, Oral) Active. Vitamin D (2000UNIT Capsule, Oral) Active. Aspirin Child (81MG  Tablet Chewable, Oral) Active. Metoprolol Succinate ER (50MG  Tablet ER 24HR, Oral) Active. CoQ10 (100MG  Capsule, Oral) Active. Trandolapril (1MG  Tablet, Oral) Active. Aleve (220MG  Tablet, Oral) Active. Fluocinonide (0.05% Cream, External) Active. (prn psoriasis) Fluocinonide (0.05% Solution, External) Active. (prn psiriasis) Medications Reconciled.    Objective Transcription  She is a pleasant woman who appears younger than her stated age. She is alert and oriented times three. No shortness of breath or chest pain. The abdomen is soft and nontender. No loss of bowel or bladder control. She still has significant left leg pain in  the radicular fashion. No weakness. Diffuse numbness as well. Intact but  diminished peripheral pulses. Unfortunately her exam is essentially unchanged from previous visit.     Assessment & Plan  At this point in time we have had a long discussion about further conservative management versus surgery. She would like to proceed with surgery. I think the principle source of her pain is the L4-L5 cyst. However, there is stenosis at the L3-L4 level and I think it is reasonable since it is in very close proximity to do a laminotomy at L3 just to decompress the lateral recess and then do a more aggressive decompression at L4-L5 to excise the cyst and a portion of that left L4-L5 facet.    We have reviewed the surgery in detail. All of she and her son's questions have been addressed. Risks of surgery include infection, bleeding, nerve damage, death, stroke, paralysis, loss of bowel or bladder control, ongoing or work pain, leak of spinal fluid, and need for further surgery. Once we have the medical clearance, it should not be that much of an issue since she just had a cardiology evaluation, we will proceed with getting a surgical date.

## 2014-01-31 NOTE — Anesthesia Procedure Notes (Signed)
Procedure Name: Intubation Date/Time: 01/31/2014 2:49 PM Performed by: Jenne Campus Pre-anesthesia Checklist: Patient identified, Emergency Drugs available, Suction available, Patient being monitored and Timeout performed Patient Re-evaluated:Patient Re-evaluated prior to inductionOxygen Delivery Method: Circle system utilized Preoxygenation: Pre-oxygenation with 100% oxygen Intubation Type: IV induction Ventilation: Mask ventilation without difficulty and Oral airway inserted - appropriate to patient size Laryngoscope Size: Miller and 2 Grade View: Grade I Tube type: Oral Tube size: 7.0 mm Number of attempts: 1 Airway Equipment and Method: Stylet Placement Confirmation: ETT inserted through vocal cords under direct vision,  positive ETCO2,  CO2 detector and breath sounds checked- equal and bilateral Secured at: 21 cm Tube secured with: Tape Dental Injury: Teeth and Oropharynx as per pre-operative assessment

## 2014-01-31 NOTE — Anesthesia Postprocedure Evaluation (Signed)
  Anesthesia Post-op Note  Patient: [name]  Procedure(s) Performed: Procedure(s): L3-5 DECOMPRESSION/REMOVAL OF FACET CYST (N/A)  Patient Location: PACU  Anesthesia Type:General  Level of Consciousness: awake, alert , sedated and patient cooperative  Airway and Oxygen Therapy: Patient Spontanous Breathing  Post-op Pain: mild  Post-op Assessment: Post-op Vital signs reviewed, Patient's Cardiovascular Status Stable, Respiratory Function Stable, Patent Airway, No signs of Nausea or vomiting and Pain level controlled  Post-op Vital Signs: stable  Complications: No apparent anesthesia complications

## 2014-01-31 NOTE — Preoperative (Signed)
Beta Blockers   Reason not to administer Beta Blockers:Not Applicable 

## 2014-01-31 NOTE — Brief Op Note (Signed)
01/31/2014  5:52 PM  PATIENT:  [name]  80 y.o. female  PRE-OPERATIVE DIAGNOSIS:  L3-4 STENOSIS/L4-5 SYNOVIAL FACET CYST  POST-OPERATIVE DIAGNOSIS:  L3-4 STENOSIS/L4-5 SYNOVIAL FACET CYST  PROCEDURE:  Procedure(s): L3-5 DECOMPRESSION/REMOVAL OF FACET CYST (N/A)  SURGEON:  Surgeon(s) and Role:    * Melina Schools, MD - Primary  PHYSICIAN ASSISTANT:   ASSISTANTS: Benjiman Core    ANESTHESIA:   general  EBL:  Total I/O In: 1000 [I.V.:1000] Out: 125 [Urine:125]  BLOOD ADMINISTERED:none  DRAINS: Penrose drain in the back   LOCAL MEDICATIONS USED:   NONE  SPECIMEN:  No Specimen  DISPOSITION OF SPECIMEN:  N/A  COUNTS:  YES  TOURNIQUET:  * No tourniquets in log *  DICTATION: .Other Dictation: Dictation Number [id number]  PLAN OF CARE: Admit to inpatient   PATIENT DISPOSITION:  PACU - hemodynamically stable.

## 2014-02-01 ENCOUNTER — Encounter (HOSPITAL_COMMUNITY): Payer: Self-pay | Admitting: Orthopedic Surgery

## 2014-02-01 LAB — CBC
HCT: 34.5 % — ABNORMAL LOW (ref 36.0–46.0)
Hemoglobin: 11.8 g/dL — ABNORMAL LOW (ref 12.0–15.0)
MCH: 31.1 pg (ref 26.0–34.0)
MCHC: 34.2 g/dL (ref 30.0–36.0)
MCV: 91 fL (ref 78.0–100.0)
PLATELETS: 283 10*3/uL (ref 150–400)
RBC: 3.79 MIL/uL — ABNORMAL LOW (ref 3.87–5.11)
RDW: 12.7 % (ref 11.5–15.5)
WBC: 11.6 10*3/uL — AB (ref 4.0–10.5)

## 2014-02-01 MED ORDER — HYDROCODONE-ACETAMINOPHEN 7.5-325 MG PO TABS: 1.0000 | ORAL_TABLET | Freq: Four times a day (QID) | ORAL | Status: DC | PRN

## 2014-02-01 MED ORDER — METHOCARBAMOL 500 MG PO TABS: 500.0000 mg | ORAL_TABLET | Freq: Three times a day (TID) | ORAL | Status: DC | PRN

## 2014-02-01 MED ORDER — ACETAMINOPHEN 500 MG PO TABS
1000.0000 mg | ORAL_TABLET | Freq: Four times a day (QID) | ORAL | Status: AC
  Administered 2014-02-01 (×2): 1000 mg via ORAL
  Filled 2014-02-01 (×2): qty 2

## 2014-02-01 MED ORDER — POLYETHYLENE GLYCOL 3350 17 G PO PACK
17.0000 g | PACK | Freq: Every day | ORAL | Status: DC
  Administered 2014-02-02: 17 g via ORAL
  Filled 2014-02-01 (×3): qty 1

## 2014-02-01 NOTE — Op Note (Signed)
NAMEAMBERLI, [name]              ACCOUNT NO.:  [id number]  MEDICAL RECORD NO.:  [id number]  LOCATION:  5N21C                        FACILITY:  [facility]  PHYSICIAN:  Dahlia Bailiff, MD    DATE OF BIRTH:  Aug 28, 1929  DATE OF PROCEDURE:  01/31/2014 DATE OF DISCHARGE:                              OPERATIVE REPORT   PREOPERATIVE DIAGNOSIS:  Lumbar spinal stenosis, L3-L4 and L4-L5 with synovial cyst, L3-L4.  POSTOPERATIVE DIAGNOSIS:  Lumbar spinal stenosis, L3-L4 and L4-L5 with synovial cyst, L3-L4.  OPERATIVE PROCEDURE:  L3-L4, L4-L5, L5-S1 decompression for severe spinal stenosis.  INTRAOPERATIVE FINDINGS:  The patient does have an extra lumbar vertebrae.  The cyst was noted to be at the second fullest disk space, which was deemed L3-L4.  The last incomplete disk space was deemed as L5- S1 based on that.  The decompression went from L3 down to S1.  HISTORY:  This is a very pleasant 80 year old woman who has been having severe progressive debilitating back, buttock, and left radicular leg pain.  Preoperative MRI studies and clinical exam was consistent with spinal stenosis with neurogenic claudication with a cyst causing left- sided nerve compression.  After attempts of conservative management had failed to alleviate her symptoms, we elected to proceed with surgery. All appropriate risks, benefits, and alternatives to surgery were discussed with the patient and consent was obtained.  FIRST ASSISTANT:  Alyson Locket. Velora Heckler.  OPERATIVE NOTE:  The patient was brought to the operating theater, placed supine on the operating table.  After successful induction of general anesthesia and endotracheal intubation, TEDs, SCDs, and Foley were inserted, she was turned prone onto the Wilson frame.  All bony prominences were well padded.  The back was prepped and draped in a standard fashion.  Time-out was taken to confirm patient, procedure, and all other pertinent important data.  Once this  was completed, 2 needles were placed in the back and x-ray was taken for localization of the incision.  A midline incision was made starting at the superior aspect of the L3 level down to the S1 spinous process.  Sharp dissection was carried out in the midline down to the deep fascia.  The deep fascia was sharply incised using a Cobb and Bovie.  I stripped the paraspinal muscles to expose the L3, L4, L5 and a portion of the S1 lamina.  I then went out to the facet complexes.  This was done bilaterally.  Once I had the posterior exposure completed, I placed a self-retaining retractor, and then took a second intraoperative x-ray.  I confirmed once again that the second to the last full disk space was the L3-L4 level and this was marked.  I then removed the L4 spinous process and the L5 spinous process in their entirety and a portion of that of L3.  Once this was completed, because of the severity of the stenosis, I had to go a level down in order to gain access.  At the L5-S1 level, I used a curette to develop a plane underneath the lamina.  I then used __________ Kerrison to perform a central decompression.  At this point, I noted there was significant right lateral recess stenosis  causing marked compression of the thecal sac __________ ligamentum flavum was also very adherent to the thecal sac.  Care was taken to carefully dissect using a Penfield 4 and [facility], and a small nerve curette to dissect the adherent ligamentum flavum away from the thecal sac.  This allowed me to enter into the lateral recess.  Since I was standing on the patient's left and working across the table, I was decompressing the right lateral recess.  I identified the S1 nerve root and traced it into the entrance of its foramen and then I decompressed laterally until I was at the medial border of the S1 pedicle.  I then went superiorly.  The L5 nerve root was significantly compressed in the foramen.  I  performed a generous foraminotomy and I decompressed until I could see the medial border of the L5 pedicle.  The L4-L5 level, even though this is the right side and the cyst was on the left, there was still significant spinal stenosis and facet arthrosis.  I removed the overhanging osteophyte of the facet capsule and entered into the lateral recess.  I decompressed, identified the L4 nerve root superior to the disk space, and then traced it down into the L5 nerve root above the L4- L5 disk space and traced it into the foramen.  Once I had the lateral recess complete, I then went to the next level up.  I made sure I was well above the disk space as this, based on the preoperative MRI was the area of most severe spinal stenosis, was at that disk space level.  Once I was cephalad to the disk space, I was satisfied with the decompression.  I then switched sides and then began working into the contralateral side into the left lateral recess.  Again on this side, there was even greater amounts of stenosis.  The cyst itself was removed.  The ligamentum flavum had become adherent to the thecal sac. Multiple attempts using a Penfield 4, Woodson elevator, curette to dissect this off was unsuccessful.  I elected to just perform a more generous laminotomy, so that there was no further pressure down the thecal sac.  Once this was done, I continued into the lateral recess with the 2 mm Kerrison identifying the L4 nerve root and tracing it into the foramen and performing a very generous foraminotomy.  The cyst was excised piecemeal.  I then continued into the lateral recess down to the L5 pedicle and again towards the S1 nerve root itself.  Once I had an adequate decompression, I irrigated the wound copiously with normal saline.  I then placed markers at the cephalad and caudad levels of my decompression and took an x-ray.  Again, I confirmed that I __________ the appropriate level where the stenosis  was most prominent and where the cyst itself was present.  Once this was completed, I irrigated the wound copiously with normal saline, used bipolar electrocautery to obtain hemostasis.  I then placed thrombin-soaked Gelfoam patty over the exposed thecal sac and then placed antibiotic impregnated beads into the wound as well as a drain.  I then closed the deep fascia with interrupted #1 Vicryl sutures, irrigated and then closed superficial with 2-0 Vicryl sutures and then a 3-0 Monocryl for the skin.  Steri-Strips and dry dressing were applied.  At the end of the case, all needle and sponge counts were correct.  The patient was extubated, transferred to PACU without incident.  P.A. Benjiman Core was instrumental  in assisting with the irrigation, retraction, visualization, and wound closure.     Dahlia Bailiff, MD     DDB/MEDQ  D:  01/31/2014  T:  02/01/2014  Job:  [id number]

## 2014-02-01 NOTE — Progress Notes (Signed)
Subjective: Doing well this morning.  No complaints.  Planning to transfer to [facility] this weekend.     Objective: Vital signs in last 24 hours: Temp:  [97.4 F (36.3 C)-97.6 F (36.4 C)] 97.6 F (36.4 C) (02/13 0508) Pulse Rate:  [58-76] 76 (02/13 0508) Resp:  [9-18] 16 (02/13 0508) BP: (91-135)/(54-88) 102/88 mmHg (02/13 0508) SpO2:  [91 %-100 %] 94 % (02/13 0508) Weight:  [72.576 kg (160 lb)] 72.576 kg (160 lb) (02/13 1155)  Intake/Output from previous day: 02/12 0701 - 02/13 0700 In: 1000 [I.V.:1000] Out: 360 [Urine:225; Drains:135] Intake/Output this shift: Total I/O In: 60 [P.O.:60] Out: -    Recent Labs  02/01/14 0715  HGB 11.8*    Recent Labs  02/01/14 0715  WBC 11.6*  RBC 3.79*  HCT 34.5*  PLT 283   No results found for this basename: NA, K, CL, CO2, BUN, CREATININE, GLUCOSE, CALCIUM,  in the last 72 hours No results found for this basename: LABPT, INR,  in the last 72 hours  Exam:  Alert and oriented.  Neurologically intact  Assessment/Plan: Anticipate transfer to [facility] Sunday.  Has bed available.  Continue present care.  Pull drain Saturday.     Vanna Shavers M 02/01/2014, 12:13 PM

## 2014-02-01 NOTE — Progress Notes (Signed)
OT Cancellation Note  Patient Details Name: [name] MRN: [id number] DOB: 1929-08-26   Cancelled Treatment:      Per chart pt is Medicare and plan is for SNF. Will defer OT eval to that facility. Please let OT know if eval is needed by contacting [phone] or going to [email or web address] (automatically changes to [facility]) or using [employer] thru the Web Links tab on [employer] and do an all OT page at [phone]   Almon Register [phone] 02/01/2014, 12:52 PM

## 2014-02-01 NOTE — Progress Notes (Signed)
SCHEDULED IV ACETAMINOPHEN:  CONVERSION TO ORAL ROUTE to complete the ordered doses.  The Pharmacy and Therapeutics Committee has restricted administration of IV acetaminophen (with a 24 hr maximum duration) to patients who meet both of the following criteria:  Unable to tolerate oral or enteral medication  Contraindication to NSAIDs  Because the patient has taken other oral medications today, IV acetaminophen has been converted to PO to complete the course of therapy originally ordered.  If PO acetaminophen should be continued beyond the original stop time, please adjust the order accordingly using the "modify" function.   If you have questions about this conversion, please contact the pharmacy department.  [name], [facility] 02/01/2014 12:07 PM

## 2014-02-01 NOTE — Progress Notes (Signed)
Pt has bp of 91/58, asymptomatic, without distress or discomfort. Verlin Fester MD paged. Per MD, continue to monitor. No new orders at this time. Velora Mediate

## 2014-02-01 NOTE — Care Management Note (Signed)
CARE MANAGEMENT NOTE 02/01/2014  Patient:  [name], [name]   Account Number:  [id number]  Date Initiated:  02/01/2014  Documentation initiated by:  Ricki Miller  Subjective/Objective Assessment:   80 yr old female s/p L3-4 decompression lumbar laminectomy, L4-5 cyst removal     Action/Plan:   PT/OT eval.  Patient is for shortterm rehab at [facility], she pre-registered at [facility]. Social worker is aware.   Anticipated DC Date:  02/02/2014   Anticipated DC Plan:  SKILLED NURSING FACILITY  In-house referral  Clinical Social Worker      DC Planning Services  CM consult      Choice offered to / List presented to:          [facility] arranged  NA      Status of service:  Completed, signed off Medicare Important Message given?   (If response is "NO", the following Medicare IM given date fields will be blank) Date Medicare IM given:   Date Additional Medicare IM given:    Discharge Disposition:  [place]  Per UR Regulation:

## 2014-02-01 NOTE — Plan of Care (Signed)
Problem: Diagnosis - Type of Surgery Goal: General Surgical Patient Education (See Patient Education module for education specifics) Outcome: Completed/Met Date Met:  02/01/14 Lumbar laminectomy and decompression or L3-L4 and L4-L5

## 2014-02-01 NOTE — Progress Notes (Signed)
Utilization review completed.  

## 2014-02-01 NOTE — Discharge Summary (Signed)
Patient ID: [name] MRN: [id number] DOB/AGE: 06/08/1929 80 y.o.  Admit date: 01/31/2014 Discharge date: 02/03/2014  Admission Diagnoses:  Active Problems:   Spinal stenosis   Discharge Diagnoses:  Active Problems:   Spinal stenosis  status post Procedure(s): L3-5 DECOMPRESSION/REMOVAL OF FACET CYST  Past Medical History  Diagnosis Date  . Hypertension   . Hyperlipidemia   . Coronary artery disease     CABG  x4 LIMA to LAD saphenous vein graft to diagonal, saphenous vein graft to circumflex marginal, saphenous vein graft to distal RAD -- Endoscopic vein harvesting of conduit  . Hypothyroidism   . Cerebrovascular disease   . Chronic diarrhea   . Arthritis   . Pancreatitis   . Acute cholecystitis   . Complication of anesthesia     "doesnt take a lot " medicine  . Acute renal failure (ARF)     history of    denies  . Fuch's endothelial dystrophy     Surgeries: Procedure(s): L3-5 DECOMPRESSION/REMOVAL OF FACET CYST on 01/31/2014   Consultants:  none  Discharged Condition: Improved  Hospital Course: [name] is an 80 y.o. female who was admitted 01/31/2014 for operative treatment of <principal problem not specified>. Patient failed conservative treatments (please see the history and physical for the specifics) and had severe unremitting pain that affects sleep, daily activities and work/hobbies. After pre-op clearance, the patient was taken to the operating room on 01/31/2014 and underwent  Procedure(s): L3-5 DECOMPRESSION/REMOVAL OF FACET CYST.  13 feb, patient doing very well.  No complaints.    Patient was given perioperative antibiotics: Anti-infectives   Start     Dose/Rate Route Frequency Ordered Stop   01/31/14 2200  ceFAZolin (ANCEF) IVPB 1 g/50 mL premix     1 g 100 mL/hr over 30 Minutes Intravenous Every 8 hours 01/31/14 2056 02/01/14 0609   01/31/14 1630  vancomycin (VANCOCIN) powder  Status:  Discontinued       As needed 01/31/14 1630  01/31/14 1819   01/31/14 1515  ceFAZolin (ANCEF) IVPB 2 g/50 mL premix     2 g 100 mL/hr over 30 Minutes Intravenous  Once 01/31/14 1513 01/31/14 1450       Patient was given sequential compression devices and early ambulation to prevent DVT.   Patient benefited maximally from hospital stay and there were no complications. At the time of discharge, the patient was urinating/moving their bowels without difficulty, tolerating a regular diet, pain is controlled with oral pain medications and they have been cleared by PT/OT.   Recent vital signs: Patient Vitals for the past 24 hrs:  BP Temp Pulse Resp SpO2 Height Weight  02/01/14 1155 - - - - - 5\' 2"  (1.575 m) 72.576 kg (160 lb)  02/01/14 0508 102/88 mmHg 97.6 F (36.4 C) 76 16 94 % - -  02/01/14 0400 - - - 18 96 % - -  02/01/14 0138 91/58 mmHg 97.4 F (36.3 C) 58 16 98 % - -  01/31/14 2052 112/63 mmHg 97.4 F (36.3 C) 72 14 100 % - -  01/31/14 2000 101/56 mmHg 97.6 F (36.4 C) 66 11 91 % - -  01/31/14 1945 112/54 mmHg - 64 14 96 % - -  01/31/14 1930 101/58 mmHg - 65 12 96 % - -  01/31/14 1915 135/57 mmHg - 66 13 97 % - -  01/31/14 1900 128/65 mmHg - 67 9 97 % - -  01/31/14 1845 126/63 mmHg - 68  12 96 % - -  01/31/14 1830 112/56 mmHg 97.6 F (36.4 C) 73 16 95 % - -     Recent laboratory studies:  Recent Labs  02/01/14 0715  WBC 11.6*  HGB 11.8*  HCT 34.5*  PLT 283     Discharge Medications:     Medication List    STOP taking these medications       Fish Oil 1200 MG Caps     Glucosamine-Chondroitin-MSM 500-200-150 MG Tabs     naproxen sodium 220 MG tablet  Commonly known as:  ANAPROX      TAKE these medications       aspirin 81 MG tablet  Take 81 mg by mouth daily.     BIOTIN PO  Take 1 tablet by mouth daily.     cholecalciferol 1000 UNITS tablet  Commonly known as:  VITAMIN D  Take 1,000 Units by mouth daily.     CO Q 10 PO  Take 1 capsule by mouth daily.     docusate sodium 50 MG capsule  Commonly  known as:  COLACE  Take by mouth daily as needed for mild constipation.     fluocinonide cream 0.05 %  Commonly known as:  LIDEX  Apply 1 application topically 2 (two) times daily.     HYDROcodone-acetaminophen 7.5-325 MG per tablet  Commonly known as:  NORCO  Take 1 tablet by mouth every 6 (six) hours as needed for moderate pain.     HYDROcodone-acetaminophen 7.5-325 MG per tablet  Commonly known as:  NORCO  Take 1 tablet by mouth every 6 (six) hours as needed for moderate pain.     levothyroxine 50 MCG tablet  Commonly known as:  SYNTHROID, LEVOTHROID  Take 50 mcg by mouth. Five days a week     methocarbamol 500 MG tablet  Commonly known as:  ROBAXIN  Take 1 tablet (500 mg total) by mouth every 8 (eight) hours as needed for muscle spasms.     metoprolol succinate 25 MG 24 hr tablet  Commonly known as:  TOPROL-XL  Take 25 mg by mouth daily.     metoprolol succinate 50 MG 24 hr tablet  Commonly known as:  TOPROL-XL  TAKE 1 TABLET BY MOUTH DAILY        Diagnostic Studies: Dg Chest 2 View  01/25/2014   CLINICAL DATA:  Hypertension.  EXAM: CHEST  2 VIEW  COMPARISON:  DG CHEST 1V PORT dated 02/03/2010  FINDINGS: Prior CABG. Heart size normal.Changes consistent with bilateral pleural parenchymal scarring. No focal infiltrate. No pleural effusion or pneumothorax. Degenerative changes thoracic spine. Degenerative changes both shoulders.  IMPRESSION: 1. Prior CABG. 2. Pleural parenchymal scarring.  No acute cardiopulmonary disease.   Electronically Signed   By: Marcello Moores  Register   On: 01/25/2014 11:06   Dg Lumbar Spine 2-3 Views  01/31/2014   CLINICAL DATA:  80 year old female undergoing lumbar surgery. Initial encounter.  EXAM: LUMBAR SPINE - 2-3 VIEW  COMPARISON:  CT Abdomen and Pelvis 02/05/2010. Chest radiographs 01/25/2014.  FINDINGS: Twelve full size ribs on knee 01/25/2014 comparison. Subsequently 02/05/2010 study confirms vestigial S1-S2 disc space. Chronic mild grade 1  anterolisthesis of L5 on S1 by this numbering system.  Intraoperative portable cross-table lateral view of the lumbar spine labeled film 1 at 1458 hrs. Cephalad needle directed at the L4 pedicle level. Caudal needle directed at the vestigial S1-S2 disc space.  Intraoperative portable cross-table lateral view of the lumbar spine labeled film 2 at 1520  hrs. Cephalad clamp directed toward the L4 pedicle. Caudal clamp directed at the L5 pedicle.  IMPRESSION: Intraoperative localization as above. The numbering system on this study was discussed and confirmed by telephone with Dr. Melina Schools in the [facility] on 01/31/2014 at 15:43 .   Electronically Signed   By: Lars Pinks M.D.   On: 01/31/2014 15:44   Dg Lumbar Spine 1 View  01/31/2014   CLINICAL DATA:  Lumbar decompression  EXAM: LUMBAR SPINE - 1 VIEW  COMPARISON:  Film from earlier in the same day  FINDINGS: Surgical instruments are now noted overlying the knees spinal canal at the L4 and S1 levels. Correlation with the operative findings is recommended. The numbering nomenclature is similar to that used on the previous film.   Electronically Signed   By: Inez Catalina M.D.   On: 01/31/2014 18:32        Discharge Orders   Future Orders Complete By Expires   Call MD / Call 911  As directed    Comments:     If you experience chest pain or shortness of breath, CALL 911 and be transported to the hospital emergency room.  If you develope a fever above 101 F, pus (white drainage) or increased drainage or redness at the wound, or calf pain, call your surgeon's office.   Constipation Prevention  As directed    Comments:     Drink plenty of fluids.  Prune juice may be helpful.  You may use a stool softener, such as Colace (over the counter) 100 mg twice a day.  Use MiraLax (over the counter) for constipation as needed.   Diet - low sodium heart healthy  As directed    Discharge instructions  As directed    Comments:     Ok to shower 5 days postop.  Do not apply any  creams or ointments to incision.  Do not remove steri-strips.  Can use 4x4 gauze and tape for dressing changes.  No aggressive activity.  No bending, squatting or prolonged sitting.  Mostly be in reclined position or lying down.  Wear brace.  Ok to shower 5 days postop.  Do not apply any creams or ointments to incision.  Do not remove steri-strips.  Can use 4x4 gauze and tape for dressing changes.  No aggressive activity.  No bending, squatting or prolonged sitting.  Mostly be in reclined position or lying down.   Driving restrictions  As directed    Comments:     No driving until further notice.No driving until further notice.   Increase activity slowly as tolerated  As directed    Lifting restrictions  As directed    Comments:     No lifting until further notice.        Discharge Plan:  discharge to   Disposition: transfer to [facility] for rehab.      Signed: Lanae Crumbly for Dr. Melina Schools [facility] [phone] 02/01/2014, 12:28 PM

## 2014-02-01 NOTE — Care Management Note (Signed)
CARE MANAGEMENT NOTE 02/01/2014  Patient:  [name], [name]   Account Number:  [id number]  Date Initiated:  02/01/2014  Documentation initiated by:  Ricki Miller  Subjective/Objective Assessment:   80 yr old female s/p L3-4 decompression lumbar laminectomy, L4-5 cyst removal     Action/Plan:   PT/OT eval. CM will continue to follow for patient needs.   Anticipated DC Date:     Anticipated DC Plan:           Choice offered to / List presented to:             Status of service:  In process, will continue to follow

## 2014-02-01 NOTE — Plan of Care (Signed)
Problem: Consults Goal: Diagnosis - Spinal Surgery Outcome: Completed/Met Date Met:  02/01/14 Lumbar Laminectomy (Complex)

## 2014-02-01 NOTE — Progress Notes (Signed)
    Subjective: Procedure(s) (LRB): L3-5 DECOMPRESSION/REMOVAL OF FACET CYST (N/A) 1 Day Post-Op  Patient reports pain as 2 on 0-10 scale.  Reports decreased leg pain reports incisional back pain   Positive void Negative bowel movement Positive flatus Negative chest pain or shortness of breath  Objective: Vital signs in last 24 hours: Temp:  [97.4 F (36.3 C)-97.6 F (36.4 C)] 97.6 F (36.4 C) (02/13 0508) Pulse Rate:  [58-76] 76 (02/13 0508) Resp:  [9-18] 16 (02/13 0508) BP: (91-135)/(54-88) 102/88 mmHg (02/13 0508) SpO2:  [91 %-100 %] 94 % (02/13 0508) Weight:  [72.576 kg (160 lb)] 72.576 kg (160 lb) (02/13 1155)  Intake/Output from previous day: 02/12 0701 - 02/13 0700 In: 1000 [I.V.:1000] Out: 360 [Urine:225; Drains:135]  Labs:  Recent Labs  02/01/14 0715  WBC 11.6*  RBC 3.79*  HCT 34.5*  PLT 283   No results found for this basename: NA, K, CL, CO2, BUN, CREATININE, GLUCOSE, CALCIUM,  in the last 72 hours No results found for this basename: LABPT, INR,  in the last 72 hours  Physical Exam: Neurologically intact ABD soft Intact pulses distally Incision: dressing C/D/I Compartment soft  Assessment/Plan: Patient stable  xrays n/a Continue mobilization with physical therapy Continue care  Advance diet Up with therapy D/C IV fluids Discharge to [facility] Monday  Melina Schools, MD [place] [phone]

## 2014-02-01 NOTE — Evaluation (Signed)
Physical Therapy Evaluation Patient Details Name: [name] MRN: [id number] DOB: 1929-06-24 Today's Date: 02/01/2014 Time: 0950-1040 PT Time Calculation (min): 50 min  PT Assessment / Plan / Recommendation History of Present Illness  Pt s/p L3-5 DECOMPRESSION/REMOVAL OF FACET CYST after failing conservative efforts for back and L buttocks pain.  Clinical Impression  Patient continues to be unsafe to discharge home alone due to having 16 steps to get to bedroom and will benefit from continued post-acute therapy. Currently recommending skilled nursing facility stay for short term rehab before returning home alone.  Will follow acutely to address below deficits.      PT Assessment  Patient needs continued PT services    Follow Up Recommendations  SNF    Does the patient have the potential to tolerate intense rehabilitation      Barriers to Discharge Inaccessible home environment 16 stairs to get to bedroom/bathroom/laundry    Equipment Recommendations  None recommended by PT    Recommendations for Other Services     Frequency Min 5X/week    Precautions / Restrictions Precautions Precautions: Back Precaution Booklet Issued: Yes (comment) Precaution Comments: illustrated handout given to pt and reviewed Required Braces or Orthoses: Spinal Brace Spinal Brace: Lumbar corset;Applied in sitting position   Pertinent Vitals/Pain 5/10 with gait      Mobility  Bed Mobility Overal bed mobility: Needs Assistance Bed Mobility: Rolling;Supine to Sit Rolling: Min assist Supine to sit: Min assist General bed mobility comments: verbal and tactile cueing for proper body mechanics. Transfers Overall transfer level: Needs assistance Equipment used: Rolling walker (2 wheeled) Transfers: Sit to/from Stand Sit to Stand: Min guard General transfer comment: cues for hand placement.  Donned brace at EOB.  Cueing for scooting. Ambulation/Gait Ambulation/Gait assistance: Min  guard Ambulation Distance (Feet): 100 Feet (x 2 reps) Assistive device: Rolling walker (2 wheeled) Gait Pattern/deviations: Step-through pattern Gait velocity: decreased General Gait Details: Cueing for posture and proper RW placement    Exercises     PT Diagnosis: Difficulty walking  PT Problem List: Decreased balance;Decreased mobility;Decreased knowledge of precautions;Decreased knowledge of use of DME PT Treatment Interventions: Gait training;Functional mobility training;Therapeutic activities;Therapeutic exercise;Balance training     PT Goals(Current goals can be found in the care plan section) Acute Rehab PT Goals Patient Stated Goal: Go to short term rehab due to living in 2 story home. PT Goal Formulation: With patient/family Time For Goal Achievement: 02/15/14 Potential to Achieve Goals: Good  Visit Information  Last PT Received On: 02/01/14 Assistance Needed: +1 History of Present Illness: Pt s/p L3-5 DECOMPRESSION/REMOVAL OF FACET CYST after failing conservative efforts for back and L buttocks pain.       Prior [place] expects to be discharged to:: Skilled nursing facility (Pt plans on going to [facility] per son) Living Arrangements: Alone Type of Home: House Home Access: Stairs to enter Technical brewer of Steps: 2 Entrance Stairs-Rails: Right Home Layout: Two level Alternate Level Stairs-Number of Steps: 16 with landing in the middle Alternate Level Stairs-Rails: Right Home Equipment: Cane - single point;Bedside commode;Walker - 2 wheels Prior Function Level of Independence: Independent Communication Communication: No difficulties    Cognition  Cognition Arousal/Alertness: Awake/alert Behavior During Therapy: WFL for tasks assessed/performed Overall Cognitive Status: Within Functional Limits for tasks assessed    Extremity/Trunk Assessment Upper Extremity Assessment Upper Extremity Assessment: Defer to OT  evaluation Lower Extremity Assessment Lower Extremity Assessment: Overall WFL for tasks assessed Cervical / Trunk Assessment Cervical / Trunk  Assessment: Normal   Balance Balance Overall balance assessment: Needs assistance Standing balance-Leahy Scale: Fair General Comments General comments (skin integrity, edema, etc.): Pt educated on back precautions throughout session.  Given illustrated handout.  Pt anxious at times about what she should be doing and how she would do at home even though she is planning on going ot rehab first.  End of Session PT - End of Session Equipment Utilized During Treatment: Gait belt;Back brace Activity Tolerance: Patient tolerated treatment well Patient left: in chair;with call bell/phone within reach;with family/visitor present Nurse Communication: Mobility status  GP     Leslye Puccini LUBECK 02/01/2014, 11:20 AM

## 2014-02-02 NOTE — Progress Notes (Signed)
Physical Therapy Treatment Patient Details Name: [name] MRN: [id number] DOB: 1929-06-08 Today's Date: 02/02/2014 Time: 8295-6213 PT Time Calculation (min): 23 min  PT Assessment / Plan / Recommendation  History of Present Illness Pt s/p L3-5 DECOMPRESSION/REMOVAL OF FACET CYST after failing conservative efforts for back and L buttocks pain.   PT Comments   Pt progressing well with mobility, continues to require frequent cues to maintain precautions during functional mobility and ambulation tasks.  Follow Up Recommendations  SNF     Does the patient have the potential to tolerate intense rehabilitation     Barriers to Discharge        Equipment Recommendations  None recommended by PT    Recommendations for Other Services    Frequency Min 5X/week   Progress towards PT Goals Progress towards PT goals: Progressing toward goals  Plan Current plan remains appropriate    Precautions / Restrictions Precautions Precautions: Back Required Braces or Orthoses: Spinal Brace Spinal Brace: Lumbar corset;Applied in sitting position Restrictions Weight Bearing Restrictions: No   Pertinent Vitals/Pain Pt c/o 4/10 pain in back, repositioned    Mobility  Transfers Equipment used: Rolling walker (2 wheeled) Transfers: Sit to/from Stand Sit to Stand: Supervision General transfer comment: pt requires frequent cues for hand placement, multiple reps of sit to stand with pt able to recall proper hand placement 1/5 trials Ambulation/Gait Ambulation/Gait assistance: Supervision Ambulation Distance (Feet): 100 Feet Assistive device: Rolling walker (2 wheeled) Gait velocity: decreased General Gait Details: Cueing for posture and proper RW placement.  frequent cues for not twisting when trying to talk to visitors, turning to look around    Exercises     PT Diagnosis:    PT Problem List:   PT Treatment Interventions:     PT Goals (current goals can now be found in the care plan  section)    Visit Information  Last PT Received On: 02/02/14 Assistance Needed: +1 History of Present Illness: Pt s/p L3-5 DECOMPRESSION/REMOVAL OF FACET CYST after failing conservative efforts for back and L buttocks pain.    Subjective Data      Cognition  Cognition Arousal/Alertness: Awake/alert Behavior During Therapy: WFL for tasks assessed/performed Overall Cognitive Status: Within Functional Limits for tasks assessed    Balance  Balance Sitting-balance support: Single extremity supported;Feet supported Sitting balance-Leahy Scale: Good General Comments General comments (skin integrity, edema, etc.): pt educated on back precautions throughout treatment. requires frequent reminders to maintain precautions in functional settings such as toileting, sit to stand, walking and talking with a friend.   End of Session PT - End of Session Equipment Utilized During Treatment: Back brace Activity Tolerance: Patient tolerated treatment well Patient left: in chair;with call bell/phone within reach;with family/visitor present Nurse Communication: Mobility status   GP     [name] 02/02/2014, 11:51 AM

## 2014-02-02 NOTE — Progress Notes (Signed)
Clinical Education officer, museum (CSW) received weekday handoff report from weekday [place] that patient will D/C to [facility] on Sunday. Per Pam weekend RN supervisor at [facility] patient is going to room 10-07 on [address]. Pam reported that patient can transfer to [facility] on Sunday 02/03/14. CSW will assist with D/C tomorrow.   Blima Rich, [place] Weekend CSW [phone]

## 2014-02-02 NOTE — Progress Notes (Signed)
Subjective: 2 Days Post-Op Procedure(s) (LRB): L3-5 DECOMPRESSION/REMOVAL OF FACET CYST (N/A) Patient reports pain as mild.  Notes soreness in the back, does not feel her brace is on low enough. Pain well controlled this AM. Legs feeling better. Voiding without difficulty.  Objective: Vital signs in last 24 hours: Temp:  [98.4 F (36.9 C)-99.1 F (37.3 C)] 98.4 F (36.9 C) (02/14 0546) Pulse Rate:  [68-82] 68 (02/14 0546) Resp:  [16-18] 18 (02/14 0546) BP: (108-121)/(52-63) 121/63 mmHg (02/14 0546) SpO2:  [95 %-97 %] 97 % (02/14 0546) Weight:  [72.576 kg (160 lb)] 72.576 kg (160 lb) (02/13 1155)  Intake/Output from previous day: 02/13 0701 - 02/14 0700 In: 495 [P.O.:495] Out: 120 [Drains:120] Intake/Output this shift:     Recent Labs  02/01/14 0715  HGB 11.8*    Recent Labs  02/01/14 0715  WBC 11.6*  RBC 3.79*  HCT 34.5*  PLT 283   No results found for this basename: NA, K, CL, CO2, BUN, CREATININE, GLUCOSE, CALCIUM,  in the last 72 hours No results found for this basename: LABPT, INR,  in the last 72 hours  Neurologically intact ABD soft Neurovascular intact Sensation intact distally Intact pulses distally Dorsiflexion/Plantar flexion intact Incision: dressing C/D/I and no drainage No cellulitis present Compartment soft no calf pain or sign of DVT Drain removed  Assessment/Plan: 2 Days Post-Op Procedure(s) (LRB): L3-5 DECOMPRESSION/REMOVAL OF FACET CYST (N/A) Advance diet Up with therapy D/C IV fluids Discharge to SNF Sun/Mon - [facility] Discussed Lspine precautions Discussed with Dr. Ewing Schlein, [place]. 02/02/2014, 8:44 AM

## 2014-02-03 DIAGNOSIS — Z9889 Other specified postprocedural states: Secondary | ICD-10-CM | POA: Diagnosis not present

## 2014-02-03 DIAGNOSIS — L408 Other psoriasis: Secondary | ICD-10-CM | POA: Diagnosis not present

## 2014-02-03 DIAGNOSIS — R6889 Other general symptoms and signs: Secondary | ICD-10-CM | POA: Diagnosis not present

## 2014-02-03 DIAGNOSIS — E039 Hypothyroidism, unspecified: Secondary | ICD-10-CM | POA: Diagnosis not present

## 2014-02-03 DIAGNOSIS — I10 Essential (primary) hypertension: Secondary | ICD-10-CM | POA: Diagnosis not present

## 2014-02-03 DIAGNOSIS — J189 Pneumonia, unspecified organism: Secondary | ICD-10-CM | POA: Diagnosis not present

## 2014-02-03 DIAGNOSIS — Z951 Presence of aortocoronary bypass graft: Secondary | ICD-10-CM | POA: Diagnosis not present

## 2014-02-03 DIAGNOSIS — R269 Unspecified abnormalities of gait and mobility: Secondary | ICD-10-CM | POA: Diagnosis not present

## 2014-02-03 DIAGNOSIS — E785 Hyperlipidemia, unspecified: Secondary | ICD-10-CM | POA: Diagnosis not present

## 2014-02-03 DIAGNOSIS — R279 Unspecified lack of coordination: Secondary | ICD-10-CM | POA: Diagnosis not present

## 2014-02-03 DIAGNOSIS — I2510 Atherosclerotic heart disease of native coronary artery without angina pectoris: Secondary | ICD-10-CM | POA: Diagnosis not present

## 2014-02-03 DIAGNOSIS — M48061 Spinal stenosis, lumbar region without neurogenic claudication: Secondary | ICD-10-CM | POA: Diagnosis not present

## 2014-02-03 DIAGNOSIS — M6281 Muscle weakness (generalized): Secondary | ICD-10-CM | POA: Diagnosis not present

## 2014-02-03 DIAGNOSIS — M4800 Spinal stenosis, site unspecified: Secondary | ICD-10-CM | POA: Diagnosis not present

## 2014-02-03 DIAGNOSIS — M1990 Unspecified osteoarthritis, unspecified site: Secondary | ICD-10-CM | POA: Diagnosis not present

## 2014-02-03 DIAGNOSIS — Z4789 Encounter for other orthopedic aftercare: Secondary | ICD-10-CM | POA: Diagnosis not present

## 2014-02-03 DIAGNOSIS — N390 Urinary tract infection, site not specified: Secondary | ICD-10-CM | POA: Diagnosis not present

## 2014-02-03 MED ORDER — POLYETHYLENE GLYCOL 3350 17 G PO PACK: 17.0000 g | PACK | Freq: Every day | ORAL | Status: DC

## 2014-02-03 NOTE — Progress Notes (Signed)
Patient ID: [name], female   DOB: [birth year], 80 y.o.   MRN: [id number] Subjective: 3 Days Post-Op Procedure(s) (LRB): L3-5 DECOMPRESSION/REMOVAL OF FACET CYST (N/A)    Patient reports pain as mild.  Very pleasant, doing well  Objective:   VITALS:   Filed Vitals:   02/03/14 0537  BP: 129/61  Pulse: 65  Temp: 97.6 F (36.4 C)  Resp: 16    Neurovascular intact Back dressing c/d/i, changed yesterday  LABS  Recent Labs  02/01/14 0715  HGB 11.8*  HCT 34.5*  WBC 11.6*  PLT 283    No results found for this basename: NA, K, BUN, CREATININE, GLUCOSE,  in the last 72 hours  No results found for this basename: LABPT, INR,  in the last 72 hours   Assessment/Plan: 3 Days Post-Op Procedure(s) (LRB): L3-5 DECOMPRESSION/REMOVAL OF FACET CYST (N/A)   Discharge to SNF today due to concerns regarding snow Instructions per Dr. Rolena Infante RTC within 2 weeks from surgery

## 2014-02-03 NOTE — Progress Notes (Signed)
Patient is medically stable for D/C to [facility] today. Per Pam RN supervisor at [facility] patient is going to room 1007 on [address]. Clinical Education officer, museum (CSW) prepared D/C packet and arranged non-emergency EMS ([facility]) for transport. Patient's son Ronalee Belts is aware of above and Nursing is aware of above. Please reconsult if further social work needs arise. CSW signing off.   Blima Rich, [place] Weekend CSW [phone]

## 2014-02-03 NOTE — Progress Notes (Signed)
[place] place to give report. Was unable to give report due to no one answering the phone. Patient being transferred.

## 2014-02-04 ENCOUNTER — Other Ambulatory Visit: Payer: Self-pay | Admitting: *Deleted

## 2014-02-04 ENCOUNTER — Non-Acute Institutional Stay (SKILLED_NURSING_FACILITY): Payer: Medicare Other | Admitting: Adult Health

## 2014-02-04 DIAGNOSIS — I2510 Atherosclerotic heart disease of native coronary artery without angina pectoris: Secondary | ICD-10-CM | POA: Diagnosis not present

## 2014-02-04 DIAGNOSIS — M4800 Spinal stenosis, site unspecified: Secondary | ICD-10-CM

## 2014-02-04 DIAGNOSIS — I10 Essential (primary) hypertension: Secondary | ICD-10-CM

## 2014-02-04 DIAGNOSIS — E039 Hypothyroidism, unspecified: Secondary | ICD-10-CM | POA: Diagnosis not present

## 2014-02-04 DIAGNOSIS — L409 Psoriasis, unspecified: Secondary | ICD-10-CM

## 2014-02-04 DIAGNOSIS — L408 Other psoriasis: Secondary | ICD-10-CM

## 2014-02-04 MED ORDER — HYDROCODONE-ACETAMINOPHEN 7.5-325 MG PO TABS: 1.0000 | ORAL_TABLET | Freq: Four times a day (QID) | ORAL | Status: DC | PRN

## 2014-02-04 NOTE — Discharge Summary (Signed)
Agree with above 

## 2014-02-04 NOTE — Clinical Social Work Psychosocial (Addendum)
Clinical Social Work Department BRIEF PSYCHOSOCIAL ASSESSMENT  Patient:  [name], [name]     Account Number:  [id number]     Admit date:  01/31/2014  Clinical Social Worker:  Iona Coach  Date/Time:  02/01/2014 01:00 PM  Referred by:  Physician  Date Referred:  02/01/2014 Referred for  SNF Placement   Other Referral:   Interview type:  Other - See comment Other interview type:   Patient and son Ronalee Belts    PSYCHOSOCIAL DATA Living Status:  ALONE Admitted from facility:   Level of care:   Primary support name:  Mickala Laton   [phone] Primary support relationship to patient:  CHILD, ADULT Degree of support available:   Strong support    CURRENT CONCERNS Current Concerns  Post-Acute Placement   Other Concerns:    SOCIAL WORK ASSESSMENT / PLAN 80 year old female admitted from home on 08/25/2031.  Patient referred to CSW to assist with short term SNF- she has preregistered with [facility].  Per MD- anticipates stability for d/c on Sunday 02/03/14.  CSW met with patient and her son Ronalee Belts to discuss d/c process and answer questions about rehab.  CSW also spoke to [place] Director of [employer]- bed will be availble for patient at d/c.  Fl2 placed on chart for MD's signature.CSW spoke to [place], [place] re: above.   Assessment/plan status:  Psychosocial Support/Ongoing Assessment of Needs Other assessment/ plan:   Information/referral to community resources:   SNF list deferred as patient has pre-registered with SNF    PATIENT'S/FAMILY'S RESPONSE TO PLAN OF CARE: Patient is alert, oriented and extremely pleasant.  She stated that she had made plans to go to [facility] when she found out she was needing surgery.  Patient maintains a very positive attitude re: need for short term SNF and states that she "wants to get better". She denied any concerns and asked very appropriate questions re: the d/c process for day of d/c.  Her son Ronalee Belts was noted to be very supportive  and involved in his mother's care.  He states that he is glad that she wants this short term placement and will help her in all ways possible to return home when stable.  CSW will asist with d/c planning and continued support of patient.

## 2014-02-04 NOTE — Clinical Social Work Placement (Signed)
     Clinical Social Work Department CLINICAL SOCIAL WORK PLACEMENT NOTE 02/04/2014  Patient:  [name], [name]  Account Number:  [id number] Admit date:  01/31/2014  Clinical Social Worker:  Butch Penny Koda Defrank, LCSWA  Date/time:  02/01/2014 01:45 PM  Clinical Social Work is seeking post-discharge placement for this patient at the following level of care:   SKILLED NURSING   (*CSW will update this form in Epic as items are completed)     Patient/family provided with [place] Department of Clinical Social Works list of facilities offering this level of care within the geographic area requested by the patient (or if unable, by the patients family).    Patient/family informed of their freedom to choose among providers that offer the needed level of care, that participate in Medicare, Medicaid or managed care program needed by the patient, have an available bed and are willing to accept the patient.    Patient/family informed of [facility] ownership interest in [facility], as well as of the fact that they are under no obligation to receive care at this facility.  PASARR submitted to EDS on 02/01/2014 PASARR number received from EDS on 02/01/2014  FL2 transmitted to all facilities in geographic area requested by pt/family on  02/01/2014 FL2 transmitted to all facilities within larger geographic area on   Patient informed that his/her managed care company has contracts with or will negotiate with  certain facilities, including the following:   Patient was pre-registered with [facility]- thus a full bed search was not initated.     Patient/family informed of bed offers received:  02/01/2014 Patient chooses bed at [facility] Physician recommends and patient chooses bed at    Patient to be transferred to [place] on  02/03/2014 Patient to be transferred to facility by Ambulance  [facility])  The following physician request were entered in Epic:   Additional  Comments: Pateint d/c to SNF on Sunday 02/03/14- facilitated by weekend CSW coverage.  Lorie Phenix. [place], [place]

## 2014-02-04 NOTE — Telephone Encounter (Signed)
[facility] 

## 2014-02-07 ENCOUNTER — Encounter: Payer: Self-pay | Admitting: Adult Health

## 2014-02-07 DIAGNOSIS — L409 Psoriasis, unspecified: Secondary | ICD-10-CM | POA: Insufficient documentation

## 2014-02-07 DIAGNOSIS — I2510 Atherosclerotic heart disease of native coronary artery without angina pectoris: Secondary | ICD-10-CM | POA: Insufficient documentation

## 2014-02-07 DIAGNOSIS — L4050 Arthropathic psoriasis, unspecified: Secondary | ICD-10-CM | POA: Insufficient documentation

## 2014-02-07 NOTE — Progress Notes (Signed)
Patient ID: [name], female   DOB: [birth year], 80 y.o.   MRN: [id number]               PROGRESS NOTE  DATE: 02/04/2014  FACILITY: Nursing Home Location: [facility]  LEVEL OF CARE: SNF (31)  Acute Visit  CHIEF COMPLAINT:  Follow-up Hospitalization  HISTORY OF PRESENT ILLNESS: This is an 80 year old female who has been admitted to [facility] on 02/03/14 from [facility] with primary discharge diagnosis of Spinal stenosis S/P L3-5 Decompression/Removal of facet cyst. She has been admitted for a short-term rehabilitation.  REASSESSMENT OF ONGOING PROBLEM(S):  HTN: Pt 's HTN remains stable.  Denies CP, sob, DOE, pedal edema, headaches, dizziness or visual disturbances.  No complications from the medications currently being used.  Last BP : 135/72  CAD: The angina has been stable. The patient denies dyspnea on exertion, orthopnea, pedal edema, palpitations and paroxysmal nocturnal dyspnea. No complications noted from the medication presently being used.  HYPOTHYROIDISM: The hypothyroidism remains stable. No complications noted from the medications presently being used.  The patient denies fatigue or constipation.    PAST MEDICAL HISTORY : Reviewed.  No changes.  CURRENT MEDICATIONS: Reviewed per [facility]  REVIEW OF SYSTEMS:  GENERAL: no change in appetite, no fatigue, no weight changes, no fever, chills or weakness RESPIRATORY: no cough, SOB, DOE, wheezing, hemoptysis CARDIAC: no chest pain, edema or palpitations GI: no abdominal pain, diarrhea, constipation, heart burn, nausea or vomiting  PHYSICAL EXAMINATION  GENERAL: no acute distress, normal body habitus EYES: conjunctivae normal, sclerae normal, normal eye lids NECK: supple, trachea midline, no neck masses, no thyroid tenderness, no thyromegaly LYMPHATICS: no LAN in the neck, no supraclavicular LAN RESPIRATORY: breathing is even & unlabored, BS CTAB CARDIAC: RRR, no murmur,no extra heart  sounds, no edema GI: abdomen soft, normal BS, no masses, no tenderness, no hepatomegaly, no splenomegaly PSYCHIATRIC: the patient is alert & oriented to person, affect & behavior appropriate  LABS/RADIOLOGY: Labs reviewed: Basic Metabolic Panel:  Recent Labs  01/25/14 0933  NA 142  K 4.4  CL 103  CO2 26  GLUCOSE 132*  BUN 17  CREATININE 0.90  CALCIUM 9.2    CBC:  Recent Labs  01/25/14 0933 02/01/14 0715  WBC 9.9 11.6*  HGB 15.1* 11.8*  HCT 43.4 34.5*  MCV 91.9 91.0  PLT 277 283     ASSESSMENT/PLAN:  Spinal stenosis S/P L3-5 Decompression/Removal of facet cyst - for rehabilitation Hypothyroidism - stable Hypertension - well-controlled CAD - stable Psoriasis - stable   CPT CODE: 38453  Frantz Quattrone Vargas - NP [facility] [phone]

## 2014-02-14 ENCOUNTER — Non-Acute Institutional Stay (SKILLED_NURSING_FACILITY): Payer: Medicare Other | Admitting: Internal Medicine

## 2014-02-14 ENCOUNTER — Encounter: Payer: Self-pay | Admitting: Internal Medicine

## 2014-02-14 DIAGNOSIS — E039 Hypothyroidism, unspecified: Secondary | ICD-10-CM

## 2014-02-14 DIAGNOSIS — Z9889 Other specified postprocedural states: Secondary | ICD-10-CM

## 2014-02-14 DIAGNOSIS — L408 Other psoriasis: Secondary | ICD-10-CM

## 2014-02-14 DIAGNOSIS — J189 Pneumonia, unspecified organism: Secondary | ICD-10-CM | POA: Insufficient documentation

## 2014-02-14 DIAGNOSIS — I2510 Atherosclerotic heart disease of native coronary artery without angina pectoris: Secondary | ICD-10-CM | POA: Diagnosis not present

## 2014-02-14 DIAGNOSIS — E785 Hyperlipidemia, unspecified: Secondary | ICD-10-CM | POA: Diagnosis not present

## 2014-02-14 DIAGNOSIS — I10 Essential (primary) hypertension: Secondary | ICD-10-CM | POA: Diagnosis not present

## 2014-02-14 DIAGNOSIS — L409 Psoriasis, unspecified: Secondary | ICD-10-CM

## 2014-02-14 NOTE — Progress Notes (Signed)
MRN: [id number] Name: [name]  Sex: female Age: 80 y.o. DOB: [birth year]  [place] #: [address] Facility/Room:1007 Level Of Care: SNF Provider: Inocencio Homes D Emergency Contacts: Extended Emergency Contact Information Primary Emergency Contact: Capasso,Mike Address: [place], [place] Phone: [phone] Mobile Phone: [phone] Relation: Son Secondary Emergency Contact: Lenn Cal Address: [address]          [place], [place] [address] Johnnette Litter of [place] Phone: [phone] Mobile Phone: [phone] Relation: Other  Code Status: FULL  Allergies: Phenytoin sodium extended; Epinephrine; Nitroglycerin; Valium; and Hibiclens  Chief Complaint  Patient presents with  . nursing home admission    HPI: Patient is 80 y.o. female who is s/p laminectomy for spinal stenosis and removal of a facet cyst , admitted for OT/PT.  Past Medical History  Diagnosis Date  . Hypertension   . Hyperlipidemia   . Coronary artery disease     CABG  x4 LIMA to LAD saphenous vein graft to diagonal, saphenous vein graft to circumflex marginal, saphenous vein graft to distal RAD -- Endoscopic vein harvesting of conduit  . Hypothyroidism   . Cerebrovascular disease   . Chronic diarrhea   . Arthritis   . Pancreatitis   . Acute cholecystitis   . Complication of anesthesia     "doesnt take a lot " medicine  . Acute renal failure (ARF)     history of    denies  . Fuch's endothelial dystrophy     Past Surgical History  Procedure Laterality Date  . Cardiac catheterization  02/14/2007    Three-vessel coronary artery disease with small, diffusely diseased vessels -- normal LV function with EF of 55%  . Coronary artery bypass graft  02/15/2007    x4 LIMA to LAD saphenous vein graft to diagonal, saphenous vein graft to circumflex marginal, saphenous vein graft to distal RAD -- Endoscopic vein harvesting of conduit  .  Appendectomy    . Hemorrhoid surgery  10/03/2007  . Ercp w/ sphincterotomy and balloon dilation  12/26/2007  . Cholecystectomy, laparoscopic  01/28/2010    Acute cholecystitis.  . Cholecystectomy      11  . Knee arthroscopy Right 6/14  . Lumbar laminectomy/decompression microdiscectomy N/A 01/31/2014    Procedure: L3-5 DECOMPRESSION/REMOVAL OF FACET CYST;  Surgeon: Melina Schools, MD;  Location: [facility];  Service: Orthopedics;  Laterality: N/A;      Medication List       This list is accurate as of: 02/14/14  8:34 PM.  Always use your most recent med list.               aspirin 81 MG tablet  Take 81 mg by mouth daily.     BIOTIN PO  Take 1 tablet by mouth daily.     cholecalciferol 1000 UNITS tablet  Commonly known as:  VITAMIN D  Take 1,000 Units by mouth daily.     CO Q 10 PO  Take 1 capsule by mouth daily.     docusate sodium 50 MG capsule  Commonly known as:  COLACE  Take by mouth daily as needed for mild constipation.     fluocinonide cream 0.05 %  Commonly known as:  LIDEX  Apply 1 application topically 2 (two) times daily.     HYDROcodone-acetaminophen 7.5-325 MG per tablet  Commonly known as:  NORCO  Take 1 tablet by mouth every 6 (six) hours  as needed for moderate pain.     levothyroxine 50 MCG tablet  Commonly known as:  SYNTHROID, LEVOTHROID  Take 50 mcg by mouth. Five days a week     methocarbamol 500 MG tablet  Commonly known as:  ROBAXIN  Take 1 tablet (500 mg total) by mouth every 8 (eight) hours as needed for muscle spasms.     metoprolol succinate 50 MG 24 hr tablet  Commonly known as:  TOPROL-XL  TAKE 1 TABLET BY MOUTH DAILY        No orders of the defined types were placed in this encounter.     There is no immunization history on file for this patient.  History  Substance Use Topics  . Smoking status: Former Smoker -- 0.25 packs/day for 4 years    Types: Cigarettes    Quit date: 01/25/1953  . Smokeless tobacco: Not on file  .  Alcohol Use: No    Family history is noncontributory    Review of Systems  DATA OBTAINED: from patient; doing well;only concern was that she has post -op appt tomorrow and afraid they will send her home GENERAL: Feels well no fevers, fatigue, appetite changes SKIN: No itching, rash or wounds EYES: No eye pain, redness, discharge EARS: No earache, tinnitus, change in hearing NOSE: No congestion, drainage or bleeding  MOUTH/THROAT: No mouth or tooth pain, No sore throat, No difficulty chewing or swallowing  RESPIRATORY: No cough, wheezing, SOB CARDIAC: No chest pain, palpitations, lower extremity edema  GI: No abdominal pain, No N/V/D or constipation, No heartburn or reflux  GU: No dysuria, frequency or urgency, or incontinence  MUSCULOSKELETAL: No unrelieved bone/joint pain NEUROLOGIC: No headache, dizziness or focal weakness PSYCHIATRIC: No overt anxiety or sadness. Sleeps well. No behavior issue.   Filed Vitals:   02/14/14 1950  BP: 104/58  Pulse: 73  Temp: 98.5 F (36.9 C)  Resp: 18    Physical Exam  GENERAL APPEARANCE: Alert, conversant. Appropriately groomed. No acute distress.  SKIN: No diaphoresis rash, or wounds HEAD: Normocephalic, atraumatic  EYES: Conjunctiva/lids clear. Pupils round, reactive. EOMs intact.  EARS: External exam WNL, canals clear. Hearing grossly normal.  NOSE: No deformity or discharge.  MOUTH/THROAT: Lips w/o lesions.  RESPIRATORY: Breathing is even, unlabored. Lung sounds are clear   CARDIOVASCULAR: Heart RRR no murmurs, rubs or gallops. No peripheral edema.  GASTROINTESTINAL: Abdomen is soft, non-tender, not distended w/ normal bowel sounds. No mass, ventral or inguinal hernia. No organomegally GENITOURINARY: Bladder non tender, not distended  MUSCULOSKELETAL: No abnormal joints or musculature NEUROLOGIC: Oriented X3. Cranial nerves 2-12 grossly intact. Moves all extremities no tremor. PSYCHIATRIC: Mood and affect appropriate to situation,  no behavioral issues  Patient Active Problem List   Diagnosis Date Noted  . S/P lumbar laminectomy 02/14/2014  . PNA (pneumonia) 02/14/2014  . Psoriasis 02/07/2014  . CAD (coronary artery disease) 02/07/2014  . Spinal stenosis 01/31/2014  . Preop cardiovascular exam 12/22/2012  . HYPOTHYROIDISM 03/11/2009  . HYPERLIPIDEMIA 03/11/2009  . HYPERTENSION 03/11/2009  . CORONARY ARTERY DISEASE 03/11/2009  . CEREBROVASCULAR DISEASE 03/11/2009  . CHEST PAIN 03/11/2009    CBC    Component Value Date/Time   WBC 11.6* 02/01/2014 0715   RBC 3.79* 02/01/2014 0715   HGB 11.8* 02/01/2014 0715   HCT 34.5* 02/01/2014 0715   PLT 283 02/01/2014 0715   MCV 91.0 02/01/2014 0715   LYMPHSABS 0.8 01/26/2010 0805   MONOABS 1.5* 01/26/2010 0805   EOSABS 0.0 01/26/2010 0805   BASOSABS  0.0 01/26/2010 0805    CMP     Component Value Date/Time   NA 142 01/25/2014 0933   K 4.4 01/25/2014 0933   CL 103 01/25/2014 0933   CO2 26 01/25/2014 0933   GLUCOSE 132* 01/25/2014 0933   BUN 17 01/25/2014 0933   CREATININE 0.90 01/25/2014 0933   CALCIUM 9.2 01/25/2014 0933   PROT 6.2 01/31/2010 0400   ALBUMIN 2.2* 01/31/2010 0400   AST 50* 01/31/2010 0400   ALT 53* 01/31/2010 0400   ALKPHOS 78 01/31/2010 0400   BILITOT 1.1 01/31/2010 0400   GFRNONAA 58* 01/25/2014 0933   GFRAA 67* 01/25/2014 0933    Assessment and Plan  S/P lumbar laminectomy For spinal stenosis with pain/numbness L leg; pain medications and robaxin  CAD (coronary artery disease) S/p CABG X4 4 years ago; continue ASA and B Blocker and coQ 10  HYPERTENSION Controlled with Toprol XL  HYPERLIPIDEMIA Declines statins; LDL from 5 years a go was near goal  HYPOTHYROIDISM Continue synthroid 50 mcg  Psoriasis Continue Lidex  PNA (pneumonia) Dx with cough and CXR-tx with Avelox 400 mg daily for 10 days with floraster; much improved    Hennie Duos, MD

## 2014-02-14 NOTE — Assessment & Plan Note (Signed)
Continue synthroid 50 mcg 

## 2014-02-14 NOTE — Assessment & Plan Note (Signed)
For spinal stenosis with pain/numbness L leg; pain medications and robaxin

## 2014-02-14 NOTE — Assessment & Plan Note (Signed)
Declines statins; LDL from 5 years a go was near goal

## 2014-02-14 NOTE — Assessment & Plan Note (Signed)
Continue Lidex

## 2014-02-14 NOTE — Assessment & Plan Note (Addendum)
S/p CABG X4 4 years ago; continue ASA and B Blocker and coQ 10

## 2014-02-14 NOTE — Assessment & Plan Note (Signed)
Dx with cough and CXR-tx with Avelox 400 mg daily for 10 days with floraster; much improved

## 2014-02-14 NOTE — Assessment & Plan Note (Signed)
Controlled with Toprol XL

## 2014-02-18 ENCOUNTER — Other Ambulatory Visit: Payer: Self-pay | Admitting: *Deleted

## 2014-02-18 MED ORDER — HYDROCODONE-ACETAMINOPHEN 7.5-325 MG PO TABS: ORAL_TABLET | ORAL | Status: DC

## 2014-02-18 NOTE — Telephone Encounter (Signed)
[facility] 

## 2014-02-22 ENCOUNTER — Encounter: Payer: Self-pay | Admitting: *Deleted

## 2014-02-26 ENCOUNTER — Non-Acute Institutional Stay (SKILLED_NURSING_FACILITY): Payer: Medicare Other | Admitting: Adult Health

## 2014-02-26 ENCOUNTER — Encounter: Payer: Self-pay | Admitting: Adult Health

## 2014-02-26 DIAGNOSIS — L409 Psoriasis, unspecified: Secondary | ICD-10-CM

## 2014-02-26 DIAGNOSIS — L408 Other psoriasis: Secondary | ICD-10-CM

## 2014-02-26 DIAGNOSIS — I10 Essential (primary) hypertension: Secondary | ICD-10-CM

## 2014-02-26 DIAGNOSIS — M4800 Spinal stenosis, site unspecified: Secondary | ICD-10-CM

## 2014-02-26 DIAGNOSIS — E039 Hypothyroidism, unspecified: Secondary | ICD-10-CM | POA: Diagnosis not present

## 2014-02-26 DIAGNOSIS — I2510 Atherosclerotic heart disease of native coronary artery without angina pectoris: Secondary | ICD-10-CM

## 2014-02-26 NOTE — Progress Notes (Signed)
Patient ID: [name], female   DOB: 03-01-1929, 80 y.o.   MRN: [id number]                 PROGRESS NOTE  DATE: 02/26/14  FACILITY: Nursing Home Location: [facility]  LEVEL OF CARE: SNF (31)  Acute Visit  CHIEF COMPLAINT: Discharge Notes  HISTORY OF PRESENT ILLNESS: This is an 80 year old female who is for discharge home with Home health PT, OT and Nursing. She has been admitted to [facility] on 02/03/14 from [facility] with primary discharge diagnosis of Spinal stenosis S/P L3-5 Decompression/Removal of facet cyst.Patient was admitted to this facility for short-term rehabilitation after the patient's recent hospitalization.  Patient has completed SNF rehabilitation and therapy has cleared the patient for discharge.   REASSESSMENT OF ONGOING PROBLEM(S):  HTN: Pt 's HTN remains stable.  Denies CP, sob, DOE, pedal edema, headaches, dizziness or visual disturbances.  No complications from the medications currently being used.  Last BP : 120/61   CAD: The angina has been stable. The patient denies dyspnea on exertion, orthopnea, pedal edema, palpitations and paroxysmal nocturnal dyspnea. No complications noted from the medication presently being used.  HYPOTHYROIDISM: The hypothyroidism remains stable. No complications noted from the medications presently being used.  The patient denies fatigue or constipation.  2/18  tsh 3.4900  PAST MEDICAL HISTORY : Reviewed.  No changes.  CURRENT MEDICATIONS: Reviewed per [facility]  REVIEW OF SYSTEMS:  GENERAL: no change in appetite, no fatigue, no weight changes, no fever, chills or weakness RESPIRATORY: no cough, SOB, DOE, wheezing, hemoptysis CARDIAC: no chest pain, edema or palpitations GI: no abdominal pain, diarrhea, constipation, heart burn, nausea or vomiting  PHYSICAL EXAMINATION  GENERAL: no acute distress, normal body habitus NECK: supple, trachea midline, no neck masses, no thyroid tenderness, no  thyromegaly LYMPHATICS: no LAN in the neck, no supraclavicular LAN RESPIRATORY: breathing is even & unlabored, BS CTAB CARDIAC: RRR, no murmur,no extra heart sounds, no edema GI: abdomen soft, normal BS, no masses, no tenderness, no hepatomegaly, no splenomegaly PSYCHIATRIC: the patient is alert & oriented to person, affect & behavior appropriate  LABS/RADIOLOGY: Labs reviewed: 02/19/14  Uric acid 4.8 02/06/14   WBCs 11.9 hemoglobin 12.5 hematocrit 38.3 sodium 135 potassium 4.1 glucose 99 BUN 21 creatinine 0.9 calcium 8.3 TSH 9.5621  Basic Metabolic Panel:  Recent Labs  01/25/14 0933  NA 142  K 4.4  CL 103  CO2 26  GLUCOSE 132*  BUN 17  CREATININE 0.90  CALCIUM 9.2    CBC:  Recent Labs  01/25/14 0933 02/01/14 0715  WBC 9.9 11.6*  HGB 15.1* 11.8*  HCT 43.4 34.5*  MCV 91.9 91.0  PLT 277 283     ASSESSMENT/PLAN:  Spinal stenosis S/P L3-5 Decompression/Removal of facet cyst - for home health PT, OT and nursing Hypothyroidism - stable;  Continue Synthroid Hypertension - well-controlled; continue Toprol CAD - stable; continue aspirin 81 mg Psoriasis - stable; continue Lidex cream   I have filled out patient's discharge paperwork and written prescriptions.  Patient will receive home health PT, OT and Nursing.  Total discharge time: Less than 30 minutes Discharge time involved coordination of the discharge process with Education officer, museum, nursing staff and therapy department. Medical justification for home health services verified.  CPT CODE: 30865  Seth Bake - NP [facility] [phone]

## 2014-03-07 DIAGNOSIS — R7301 Impaired fasting glucose: Secondary | ICD-10-CM | POA: Diagnosis not present

## 2014-03-07 DIAGNOSIS — L259 Unspecified contact dermatitis, unspecified cause: Secondary | ICD-10-CM | POA: Diagnosis not present

## 2014-03-07 DIAGNOSIS — S62609A Fracture of unspecified phalanx of unspecified finger, initial encounter for closed fracture: Secondary | ICD-10-CM | POA: Diagnosis not present

## 2014-03-07 DIAGNOSIS — I10 Essential (primary) hypertension: Secondary | ICD-10-CM | POA: Diagnosis not present

## 2014-03-07 DIAGNOSIS — D649 Anemia, unspecified: Secondary | ICD-10-CM | POA: Diagnosis not present

## 2014-03-07 DIAGNOSIS — K5900 Constipation, unspecified: Secondary | ICD-10-CM | POA: Diagnosis not present

## 2014-03-07 DIAGNOSIS — M62838 Other muscle spasm: Secondary | ICD-10-CM | POA: Diagnosis not present

## 2014-03-07 DIAGNOSIS — Z4789 Encounter for other orthopedic aftercare: Secondary | ICD-10-CM | POA: Diagnosis not present

## 2014-03-07 DIAGNOSIS — I25810 Atherosclerosis of coronary artery bypass graft(s) without angina pectoris: Secondary | ICD-10-CM | POA: Diagnosis not present

## 2014-03-07 DIAGNOSIS — E039 Hypothyroidism, unspecified: Secondary | ICD-10-CM | POA: Diagnosis not present

## 2014-03-07 DIAGNOSIS — I2510 Atherosclerotic heart disease of native coronary artery without angina pectoris: Secondary | ICD-10-CM | POA: Diagnosis not present

## 2014-03-07 DIAGNOSIS — R269 Unspecified abnormalities of gait and mobility: Secondary | ICD-10-CM | POA: Diagnosis not present

## 2014-03-07 DIAGNOSIS — M48061 Spinal stenosis, lumbar region without neurogenic claudication: Secondary | ICD-10-CM | POA: Diagnosis not present

## 2014-03-11 DIAGNOSIS — M48061 Spinal stenosis, lumbar region without neurogenic claudication: Secondary | ICD-10-CM | POA: Diagnosis not present

## 2014-03-11 DIAGNOSIS — I10 Essential (primary) hypertension: Secondary | ICD-10-CM | POA: Diagnosis not present

## 2014-03-11 DIAGNOSIS — Z4789 Encounter for other orthopedic aftercare: Secondary | ICD-10-CM | POA: Diagnosis not present

## 2014-03-11 DIAGNOSIS — E039 Hypothyroidism, unspecified: Secondary | ICD-10-CM | POA: Diagnosis not present

## 2014-03-11 DIAGNOSIS — R269 Unspecified abnormalities of gait and mobility: Secondary | ICD-10-CM | POA: Diagnosis not present

## 2014-03-11 DIAGNOSIS — I2510 Atherosclerotic heart disease of native coronary artery without angina pectoris: Secondary | ICD-10-CM | POA: Diagnosis not present

## 2014-03-13 DIAGNOSIS — R269 Unspecified abnormalities of gait and mobility: Secondary | ICD-10-CM | POA: Diagnosis not present

## 2014-03-13 DIAGNOSIS — I10 Essential (primary) hypertension: Secondary | ICD-10-CM | POA: Diagnosis not present

## 2014-03-13 DIAGNOSIS — M48061 Spinal stenosis, lumbar region without neurogenic claudication: Secondary | ICD-10-CM | POA: Diagnosis not present

## 2014-03-13 DIAGNOSIS — I2510 Atherosclerotic heart disease of native coronary artery without angina pectoris: Secondary | ICD-10-CM | POA: Diagnosis not present

## 2014-03-13 DIAGNOSIS — Z4789 Encounter for other orthopedic aftercare: Secondary | ICD-10-CM | POA: Diagnosis not present

## 2014-03-13 DIAGNOSIS — E039 Hypothyroidism, unspecified: Secondary | ICD-10-CM | POA: Diagnosis not present

## 2014-03-14 DIAGNOSIS — R269 Unspecified abnormalities of gait and mobility: Secondary | ICD-10-CM | POA: Diagnosis not present

## 2014-03-14 DIAGNOSIS — Z4789 Encounter for other orthopedic aftercare: Secondary | ICD-10-CM | POA: Diagnosis not present

## 2014-03-14 DIAGNOSIS — I2510 Atherosclerotic heart disease of native coronary artery without angina pectoris: Secondary | ICD-10-CM | POA: Diagnosis not present

## 2014-03-14 DIAGNOSIS — I10 Essential (primary) hypertension: Secondary | ICD-10-CM | POA: Diagnosis not present

## 2014-03-14 DIAGNOSIS — M48061 Spinal stenosis, lumbar region without neurogenic claudication: Secondary | ICD-10-CM | POA: Diagnosis not present

## 2014-03-14 DIAGNOSIS — E039 Hypothyroidism, unspecified: Secondary | ICD-10-CM | POA: Diagnosis not present

## 2014-03-18 DIAGNOSIS — I2510 Atherosclerotic heart disease of native coronary artery without angina pectoris: Secondary | ICD-10-CM | POA: Diagnosis not present

## 2014-03-18 DIAGNOSIS — M48061 Spinal stenosis, lumbar region without neurogenic claudication: Secondary | ICD-10-CM | POA: Diagnosis not present

## 2014-03-18 DIAGNOSIS — R269 Unspecified abnormalities of gait and mobility: Secondary | ICD-10-CM | POA: Diagnosis not present

## 2014-03-18 DIAGNOSIS — I10 Essential (primary) hypertension: Secondary | ICD-10-CM | POA: Diagnosis not present

## 2014-03-18 DIAGNOSIS — E039 Hypothyroidism, unspecified: Secondary | ICD-10-CM | POA: Diagnosis not present

## 2014-03-18 DIAGNOSIS — Z4789 Encounter for other orthopedic aftercare: Secondary | ICD-10-CM | POA: Diagnosis not present

## 2014-03-21 DIAGNOSIS — I10 Essential (primary) hypertension: Secondary | ICD-10-CM | POA: Diagnosis not present

## 2014-03-21 DIAGNOSIS — Z4789 Encounter for other orthopedic aftercare: Secondary | ICD-10-CM | POA: Diagnosis not present

## 2014-03-21 DIAGNOSIS — I2510 Atherosclerotic heart disease of native coronary artery without angina pectoris: Secondary | ICD-10-CM | POA: Diagnosis not present

## 2014-03-21 DIAGNOSIS — M48061 Spinal stenosis, lumbar region without neurogenic claudication: Secondary | ICD-10-CM | POA: Diagnosis not present

## 2014-03-21 DIAGNOSIS — E039 Hypothyroidism, unspecified: Secondary | ICD-10-CM | POA: Diagnosis not present

## 2014-03-21 DIAGNOSIS — R269 Unspecified abnormalities of gait and mobility: Secondary | ICD-10-CM | POA: Diagnosis not present

## 2014-04-02 DIAGNOSIS — L408 Other psoriasis: Secondary | ICD-10-CM | POA: Diagnosis not present

## 2014-04-18 DIAGNOSIS — H6120 Impacted cerumen, unspecified ear: Secondary | ICD-10-CM | POA: Diagnosis not present

## 2014-04-18 DIAGNOSIS — R252 Cramp and spasm: Secondary | ICD-10-CM | POA: Diagnosis not present

## 2014-04-18 DIAGNOSIS — I10 Essential (primary) hypertension: Secondary | ICD-10-CM | POA: Diagnosis not present

## 2014-04-18 DIAGNOSIS — M48061 Spinal stenosis, lumbar region without neurogenic claudication: Secondary | ICD-10-CM | POA: Diagnosis not present

## 2014-04-18 DIAGNOSIS — L259 Unspecified contact dermatitis, unspecified cause: Secondary | ICD-10-CM | POA: Diagnosis not present

## 2014-04-18 DIAGNOSIS — R7301 Impaired fasting glucose: Secondary | ICD-10-CM | POA: Diagnosis not present

## 2014-04-18 DIAGNOSIS — M62838 Other muscle spasm: Secondary | ICD-10-CM | POA: Diagnosis not present

## 2014-04-18 DIAGNOSIS — K5900 Constipation, unspecified: Secondary | ICD-10-CM | POA: Diagnosis not present

## 2014-04-18 DIAGNOSIS — I25810 Atherosclerosis of coronary artery bypass graft(s) without angina pectoris: Secondary | ICD-10-CM | POA: Diagnosis not present

## 2014-04-18 DIAGNOSIS — E039 Hypothyroidism, unspecified: Secondary | ICD-10-CM | POA: Diagnosis not present

## 2014-04-18 DIAGNOSIS — Z0000 Encounter for general adult medical examination without abnormal findings: Secondary | ICD-10-CM | POA: Diagnosis not present

## 2014-04-18 DIAGNOSIS — D649 Anemia, unspecified: Secondary | ICD-10-CM | POA: Diagnosis not present

## 2014-04-22 ENCOUNTER — Emergency Department (HOSPITAL_COMMUNITY): Payer: Medicare Other

## 2014-04-22 ENCOUNTER — Encounter (HOSPITAL_COMMUNITY): Payer: Self-pay | Admitting: Emergency Medicine

## 2014-04-22 ENCOUNTER — Emergency Department (HOSPITAL_COMMUNITY)
Admission: EM | Admit: 2014-04-22 | Discharge: 2014-04-22 | Disposition: A | Discharge status: Home / Self Care | Payer: Medicare Other | Attending: Emergency Medicine | Admitting: Emergency Medicine

## 2014-04-22 DIAGNOSIS — S62326A Displaced fracture of shaft of fifth metacarpal bone, right hand, initial encounter for closed fracture: Secondary | ICD-10-CM

## 2014-04-22 DIAGNOSIS — I10 Essential (primary) hypertension: Secondary | ICD-10-CM | POA: Diagnosis not present

## 2014-04-22 DIAGNOSIS — Z79899 Other long term (current) drug therapy: Secondary | ICD-10-CM | POA: Insufficient documentation

## 2014-04-22 DIAGNOSIS — Z8719 Personal history of other diseases of the digestive system: Secondary | ICD-10-CM | POA: Insufficient documentation

## 2014-04-22 DIAGNOSIS — S0990XA Unspecified injury of head, initial encounter: Secondary | ICD-10-CM | POA: Diagnosis not present

## 2014-04-22 DIAGNOSIS — Y92009 Unspecified place in unspecified non-institutional (private) residence as the place of occurrence of the external cause: Secondary | ICD-10-CM | POA: Diagnosis not present

## 2014-04-22 DIAGNOSIS — S0101XA Laceration without foreign body of scalp, initial encounter: Secondary | ICD-10-CM

## 2014-04-22 DIAGNOSIS — M129 Arthropathy, unspecified: Secondary | ICD-10-CM | POA: Insufficient documentation

## 2014-04-22 DIAGNOSIS — S62329A Displaced fracture of shaft of unspecified metacarpal bone, initial encounter for closed fracture: Secondary | ICD-10-CM | POA: Diagnosis not present

## 2014-04-22 DIAGNOSIS — Z7982 Long term (current) use of aspirin: Secondary | ICD-10-CM | POA: Insufficient documentation

## 2014-04-22 DIAGNOSIS — Z951 Presence of aortocoronary bypass graft: Secondary | ICD-10-CM | POA: Diagnosis not present

## 2014-04-22 DIAGNOSIS — I2510 Atherosclerotic heart disease of native coronary artery without angina pectoris: Secondary | ICD-10-CM | POA: Diagnosis not present

## 2014-04-22 DIAGNOSIS — S0100XA Unspecified open wound of scalp, initial encounter: Secondary | ICD-10-CM | POA: Diagnosis not present

## 2014-04-22 DIAGNOSIS — Z87891 Personal history of nicotine dependence: Secondary | ICD-10-CM | POA: Diagnosis not present

## 2014-04-22 DIAGNOSIS — Z8669 Personal history of other diseases of the nervous system and sense organs: Secondary | ICD-10-CM | POA: Insufficient documentation

## 2014-04-22 DIAGNOSIS — Z87448 Personal history of other diseases of urinary system: Secondary | ICD-10-CM | POA: Diagnosis not present

## 2014-04-22 DIAGNOSIS — S0190XA Unspecified open wound of unspecified part of head, initial encounter: Secondary | ICD-10-CM | POA: Diagnosis not present

## 2014-04-22 DIAGNOSIS — E039 Hypothyroidism, unspecified: Secondary | ICD-10-CM | POA: Diagnosis not present

## 2014-04-22 DIAGNOSIS — Z9889 Other specified postprocedural states: Secondary | ICD-10-CM | POA: Diagnosis not present

## 2014-04-22 DIAGNOSIS — W010XXA Fall on same level from slipping, tripping and stumbling without subsequent striking against object, initial encounter: Secondary | ICD-10-CM | POA: Diagnosis not present

## 2014-04-22 DIAGNOSIS — Y939 Activity, unspecified: Secondary | ICD-10-CM | POA: Diagnosis not present

## 2014-04-22 NOTE — ED Provider Notes (Signed)
CSN: [id number]     Arrival date & time 04/22/14  1330 History   First MD Initiated Contact with Patient 04/22/14 1955     Chief Complaint  Patient presents with  . Fall  . Head Laceration  . Hand Injury      Patient is a 80 y.o. female presenting with fall, scalp laceration, and hand injury. The history is provided by the patient.  Fall This is a new problem. Episode onset: several hours ago - unable to give exact time. Pertinent negatives include no chest pain and no abdominal pain. Nothing aggravates the symptoms. Nothing relieves the symptoms.  Head Laceration Pertinent negatives include no chest pain and no abdominal pain.  Hand Injury  Pt fell at home earlier today She reports she slipped on floor hitting her head in the process She reports laceration to scalp She also reports pain to right hand  No cp/sob reported No dizziness/LOC reported  Past Medical History  Diagnosis Date  . Hypertension   . Hyperlipidemia   . Coronary artery disease     CABG  x4 LIMA to LAD saphenous vein graft to diagonal, saphenous vein graft to circumflex marginal, saphenous vein graft to distal RAD -- Endoscopic vein harvesting of conduit  . Hypothyroidism   . Cerebrovascular disease   . Chronic diarrhea   . Arthritis   . Pancreatitis   . Acute cholecystitis   . Complication of anesthesia     "doesnt take a lot " medicine  . Acute renal failure (ARF)     history of    denies  . Fuch's endothelial dystrophy    Past Surgical History  Procedure Laterality Date  . Cardiac catheterization  02/14/2007    Three-vessel coronary artery disease with small, diffusely diseased vessels -- normal LV function with EF of 55%  . Coronary artery bypass graft  02/15/2007    x4 LIMA to LAD saphenous vein graft to diagonal, saphenous vein graft to circumflex marginal, saphenous vein graft to distal RAD -- Endoscopic vein harvesting of conduit  . Appendectomy    . Hemorrhoid surgery  10/03/2007  . Ercp  w/ sphincterotomy and balloon dilation  12/26/2007  . Cholecystectomy, laparoscopic  01/28/2010    Acute cholecystitis.  . Cholecystectomy      11  . Knee arthroscopy Right 6/14  . Lumbar laminectomy/decompression microdiscectomy N/A 01/31/2014    Procedure: L3-5 DECOMPRESSION/REMOVAL OF FACET CYST;  Surgeon: Melina Schools, MD;  Location: [facility];  Service: Orthopedics;  Laterality: N/A;  . Back surgery     Family History  Problem Relation Age of Onset  . Heart attack Mother 1  . Heart attack Father 6    heavy smoker   History  Substance Use Topics  . Smoking status: Former Smoker -- 0.25 packs/day for 4 years    Types: Cigarettes    Quit date: 01/25/1953  . Smokeless tobacco: Not on file  . Alcohol Use: No   OB History   Grav Para Term Preterm Abortions TAB SAB Ect Mult Living                 Review of Systems  Cardiovascular: Negative for chest pain.  Gastrointestinal: Negative for abdominal pain.      Allergies  Phenytoin sodium extended; Epinephrine; Nitroglycerin; Valium; and Hibiclens  Home Medications   Prior to Admission medications   Medication Sig Start Date End Date Taking? Authorizing Provider  acetaminophen (TYLENOL) 325 MG tablet Take 650 mg by mouth every 4 (  four) hours as needed.   Yes Historical Provider, MD  aspirin 81 MG tablet Take 81 mg by mouth daily.   Yes Historical Provider, MD  BIOTIN PO Take 1 tablet by mouth daily.   Yes Historical Provider, MD  cholecalciferol (VITAMIN D) 1000 UNITS tablet Take 1,000 Units by mouth daily.   Yes Historical Provider, MD  Coenzyme Q10 (CO Q 10 PO) Take 1 capsule by mouth daily. Take 3 capsules to equal 300 mg.   Yes Historical Provider, MD  docusate sodium (COLACE) 50 MG capsule Take by mouth daily as needed for mild constipation.    Yes Historical Provider, MD  fluocinonide cream (LIDEX) 5.57 % Apply 1 application topically 2 (two) times daily.  12/27/13  Yes Historical Provider, MD  levothyroxine (SYNTHROID,  LEVOTHROID) 50 MCG tablet Take 50 mcg by mouth See admin instructions. Patient takes 50 mcg on Monday Wednesday Thursday Saturday Sunday.   Yes Historical Provider, MD  levothyroxine (SYNTHROID, LEVOTHROID) 75 MCG tablet Take 75 mcg by mouth See admin instructions. Patient takes only on Tuesday and Friday   Yes Historical Provider, MD  magnesium hydroxide (MILK OF MAGNESIA) 400 MG/5ML suspension Take 30 mLs by mouth daily. For constipation   Yes Historical Provider, MD  metoprolol succinate (TOPROL-XL) 50 MG 24 hr tablet TAKE 1 TABLET BY MOUTH DAILY 09/12/13  Yes Lelon Perla, MD   BP 158/62  Pulse 67  Temp(Src) 98.8 F (37.1 C) (Oral)  Resp 18  SpO2 96% Physical Exam CONSTITUTIONAL: Well developed/well nourished HEAD: scalp laceration noted, no stepoffs noted EYES: EOMI/PERRL ENMT: Mucous membranes moist NECK: supple no meningeal signs SPINE:entire spine nontender CV: S1/S2 noted, no murmurs/rubs/gallops noted LUNGS: Lungs are clear to auscultation bilaterally, no apparent distress ABDOMEN: soft, nontender, no rebound or guarding NEURO: Pt is awake/alert, moves all extremitiesx4 Pt is ambulatory without any issue in the ED EXTREMITIES: pulses normal, full ROM, tenderness/swelling to right hand, ulnar aspect.  No laceration noted. All other extremities/joints palpated/ranged and nontender SKIN: warm, color normal PSYCH: no abnormalities of mood noted  ED Course  Procedures (including critical care time) Labs Review Labs Reviewed - No data to display  Imaging Review Ct Head Wo Contrast  04/22/2014   CLINICAL DATA:  Fall, head laceration  EXAM: CT HEAD WITHOUT CONTRAST  TECHNIQUE: Contiguous axial images were obtained from the base of the skull through the vertex without intravenous contrast.  COMPARISON:  None.  FINDINGS: No evidence of parenchymal hemorrhage or extra-axial fluid collection. No mass lesion, mass effect, or midline shift.  No CT evidence of acute infarction.   Subcortical white matter and periventricular small vessel ischemic changes.  Global cortical atrophy with prominence of the frontal extra-axial spaces. No ventriculomegaly.  The visualized paranasal sinuses are essentially clear. The mastoid air cells are unopacified.  Soft tissue laceration overlying the right vertex (series 3/image 72).  No evidence of calvarial fracture.  IMPRESSION: Soft tissue laceration overlying the right vertex. No evidence of calvarial fracture.  No evidence of acute intracranial abnormality.   Electronically Signed   By: Julian Hy M.D.   On: 04/22/2014 15:23   Dg Hand Complete Right  04/22/2014   CLINICAL DATA:  Fall with bruising and swelling about fifth metacarpal.  EXAM: RIGHT HAND - COMPLETE 3+ VIEW  COMPARISON:  None.  FINDINGS: Osteopenia. Scattered degenerative changes. Likely remote ulnar styloid fracture. Extensive overlap of digits on below lateral view. Oblique, mildly displaced fracture of the distal shaft of the fifth metacarpal. No  intra-articular extension.  IMPRESSION: Fifth metacarpal shaft fracture.   Electronically Signed   By: Abigail Miyamoto M.D.   On: 04/22/2014 15:06    Laceration repaired by resident, pt tolerated well, see her note  5th MC fracture splinted by ortho tech, pt tolerated well, distal cap refill less than 2 seconds with no sensory deficit    MDM   Final diagnoses:  Scalp laceration  Closed disp fracture of shaft of fifth metacarpal bone of right hand    Nursing notes including past medical history and social history reviewed and considered in documentation xrays reviewed and considered     Sharyon Cable, MD 04/22/14 2323

## 2014-04-22 NOTE — ED Provider Notes (Signed)
LACERATION REPAIR Performed by: Ma Hillock Authorized by: Ma Hillock Consent: Verbal consent obtained. Risks and benefits: risks, benefits and alternatives were discussed Consent given by: patient Patient identity confirmed: provided demographic data Prepped and Draped in normal sterile fashion Wound explored  Laceration Location: Crown of head  Laceration Length: 5 cm  No Foreign Bodies seen or palpated  Anesthesia: local infiltration  Local anesthetic: lidocaine 2 epi epinephrine  Anesthetic total: 5 ml  Irrigation method: syringe Amount of cleaning: standard  Skin closure: staples  Number of sutures: 6 staples  Technique: staples  Patient tolerance: Patient tolerated the procedure well with no immediate complications.   Ma Hillock, DO 04/24/14 1956

## 2014-04-22 NOTE — ED Notes (Signed)
Pt states she slipped on the floor and hit head on the door jam.  Lac to posterior head- bleeding controlled.  No blood thinner, no LOC , no neck pain.  Pt is alert and oriented.  Complains of pain to right hand with swelling

## 2014-04-22 NOTE — ED Notes (Addendum)
Ortho Tech Paged

## 2014-04-22 NOTE — Discharge Instructions (Signed)
Cast or Splint Care Casts and splints support injured limbs and keep bones from moving while they heal. It is important to care for your cast or splint at home.  HOME CARE INSTRUCTIONS  Keep the cast or splint uncovered during the drying period. It can take 24 to 48 hours to dry if it is made of plaster. A fiberglass cast will dry in less than 1 hour.  Do not rest the cast on anything harder than a pillow for the first 24 hours.  Do not put weight on your injured limb or apply pressure to the cast until your health care provider gives you permission.  Keep the cast or splint dry. Wet casts or splints can lose their shape and may not support the limb as well. A wet cast that has lost its shape can also create harmful pressure on your skin when it dries. Also, wet skin can become infected.  Cover the cast or splint with a plastic bag when bathing or when out in the rain or snow. If the cast is on the trunk of the body, take sponge baths until the cast is removed.  If your cast does become wet, dry it with a towel or a blow dryer on the cool setting only.  Keep your cast or splint clean. Soiled casts may be wiped with a moistened cloth.  Do not place any hard or soft foreign objects under your cast or splint, such as cotton, toilet paper, lotion, or powder.  Do not try to scratch the skin under the cast with any object. The object could get stuck inside the cast. Also, scratching could lead to an infection. If itching is a problem, use a blow dryer on a cool setting to relieve discomfort.  Do not trim or cut your cast or remove padding from inside of it.  Exercise all joints next to the injury that are not immobilized by the cast or splint. For example, if you have a long leg cast, exercise the hip joint and toes. If you have an arm cast or splint, exercise the shoulder, elbow, thumb, and fingers.  Elevate your injured arm or leg on 1 or 2 pillows for the first 1 to 3 days to decrease  swelling and pain.It is best if you can comfortably elevate your cast so it is higher than your heart. SEEK MEDICAL CARE IF:   Your cast or splint cracks.  Your cast or splint is too tight or too loose.  You have unbearable itching inside the cast.  Your cast becomes wet or develops a soft spot or area.  You have a bad smell coming from inside your cast.  You get an object stuck under your cast.  Your skin around the cast becomes red or raw.  You have new pain or worsening pain after the cast has been applied. SEEK IMMEDIATE MEDICAL CARE IF:   You have fluid leaking through the cast.  You are unable to move your fingers or toes.  You have discolored (blue or white), cool, painful, or very swollen fingers or toes beyond the cast.  You have tingling or numbness around the injured area.  You have severe pain or pressure under the cast.  You have any difficulty with your breathing or have shortness of breath.  You have chest pain. Document Released: 12/03/2000 Document Revised: 09/26/2013 Document Reviewed: 06/14/2013 [facility] Patient Information 2014 [place].   You have had a head injury which does not appear to require admission  at this time. A concussion is a state of changed mental ability from trauma.  SEEK IMMEDIATE MEDICAL ATTENTION IF: There is confusion or drowsiness (although children frequently become drowsy after injury).  You cannot awaken the injured person.  There is nausea (feeling sick to your stomach) or continued, forceful vomiting.  You notice dizziness or unsteadiness which is getting worse, or inability to walk.  You have convulsions or unconsciousness.  You experience severe, persistent headaches not relieved by Tylenol. (Do not take aspirin as this impairs clotting abilities). Take other pain medications only as directed.  You cannot use arms or legs normally.  There are changes in pupil sizes. (This is the black center in the colored part  of the eye)  There is clear or bloody discharge from the nose or ears.  Change in speech, vision, swallowing, or understanding.  Localized weakness, numbness, tingling, or change in bowel or bladder control.

## 2014-04-22 NOTE — Progress Notes (Signed)
Orthopedic Tech Progress Note Patient Details:  [name] 1929/12/12 [id number]  Ortho Devices Type of Ortho Device: Ace wrap;Arm sling;Ulna gutter splint Ortho Device/Splint Location: rue Ortho Device/Splint Interventions: Application   Braniyah Besse 04/22/2014, 8:30 PM

## 2014-04-22 NOTE — ED Notes (Signed)
Ortho Tech responded. Currently working on case, patient will be seen next.

## 2014-04-24 DIAGNOSIS — M79609 Pain in unspecified limb: Secondary | ICD-10-CM | POA: Diagnosis not present

## 2014-04-25 NOTE — ED Provider Notes (Signed)
I have personally seen and examined the patient.  I have discussed plan of care with resident.  I was present for key and critical portions of procedure as documented. I have reviewed the appropriate documentation on PMH/FH/Soc. History.  I have reviewed the documentation of the resident and agree.   Sharyon Cable, MD 04/25/14 [phone]

## 2014-04-29 DIAGNOSIS — S0100XA Unspecified open wound of scalp, initial encounter: Secondary | ICD-10-CM | POA: Diagnosis not present

## 2014-04-29 DIAGNOSIS — I10 Essential (primary) hypertension: Secondary | ICD-10-CM | POA: Diagnosis not present

## 2014-05-08 DIAGNOSIS — S62609A Fracture of unspecified phalanx of unspecified finger, initial encounter for closed fracture: Secondary | ICD-10-CM | POA: Diagnosis not present

## 2014-05-09 DIAGNOSIS — L408 Other psoriasis: Secondary | ICD-10-CM | POA: Diagnosis not present

## 2014-05-15 DIAGNOSIS — L408 Other psoriasis: Secondary | ICD-10-CM | POA: Diagnosis not present

## 2014-05-17 DIAGNOSIS — L408 Other psoriasis: Secondary | ICD-10-CM | POA: Diagnosis not present

## 2014-05-20 DIAGNOSIS — L408 Other psoriasis: Secondary | ICD-10-CM | POA: Diagnosis not present

## 2014-05-22 DIAGNOSIS — L408 Other psoriasis: Secondary | ICD-10-CM | POA: Diagnosis not present

## 2014-05-23 DIAGNOSIS — L408 Other psoriasis: Secondary | ICD-10-CM | POA: Diagnosis not present

## 2014-05-27 DIAGNOSIS — L408 Other psoriasis: Secondary | ICD-10-CM | POA: Diagnosis not present

## 2014-05-29 DIAGNOSIS — L408 Other psoriasis: Secondary | ICD-10-CM | POA: Diagnosis not present

## 2014-05-29 DIAGNOSIS — S5290XD Unspecified fracture of unspecified forearm, subsequent encounter for closed fracture with routine healing: Secondary | ICD-10-CM | POA: Diagnosis not present

## 2014-05-29 DIAGNOSIS — M79609 Pain in unspecified limb: Secondary | ICD-10-CM | POA: Diagnosis not present

## 2014-05-31 DIAGNOSIS — L408 Other psoriasis: Secondary | ICD-10-CM | POA: Diagnosis not present

## 2014-06-03 DIAGNOSIS — L408 Other psoriasis: Secondary | ICD-10-CM | POA: Diagnosis not present

## 2014-06-05 DIAGNOSIS — L408 Other psoriasis: Secondary | ICD-10-CM | POA: Diagnosis not present

## 2014-06-07 DIAGNOSIS — L408 Other psoriasis: Secondary | ICD-10-CM | POA: Diagnosis not present

## 2014-06-10 DIAGNOSIS — L408 Other psoriasis: Secondary | ICD-10-CM | POA: Diagnosis not present

## 2014-06-12 DIAGNOSIS — L408 Other psoriasis: Secondary | ICD-10-CM | POA: Diagnosis not present

## 2014-06-13 DIAGNOSIS — S5290XD Unspecified fracture of unspecified forearm, subsequent encounter for closed fracture with routine healing: Secondary | ICD-10-CM | POA: Diagnosis not present

## 2014-06-14 DIAGNOSIS — L408 Other psoriasis: Secondary | ICD-10-CM | POA: Diagnosis not present

## 2014-06-17 DIAGNOSIS — L408 Other psoriasis: Secondary | ICD-10-CM | POA: Diagnosis not present

## 2014-06-19 DIAGNOSIS — L408 Other psoriasis: Secondary | ICD-10-CM | POA: Diagnosis not present

## 2014-06-24 DIAGNOSIS — L408 Other psoriasis: Secondary | ICD-10-CM | POA: Diagnosis not present

## 2014-06-26 DIAGNOSIS — L408 Other psoriasis: Secondary | ICD-10-CM | POA: Diagnosis not present

## 2014-06-28 DIAGNOSIS — L408 Other psoriasis: Secondary | ICD-10-CM | POA: Diagnosis not present

## 2014-07-01 DIAGNOSIS — L408 Other psoriasis: Secondary | ICD-10-CM | POA: Diagnosis not present

## 2014-07-03 DIAGNOSIS — L408 Other psoriasis: Secondary | ICD-10-CM | POA: Diagnosis not present

## 2014-07-05 DIAGNOSIS — L408 Other psoriasis: Secondary | ICD-10-CM | POA: Diagnosis not present

## 2014-07-08 DIAGNOSIS — L408 Other psoriasis: Secondary | ICD-10-CM | POA: Diagnosis not present

## 2014-07-09 DIAGNOSIS — L408 Other psoriasis: Secondary | ICD-10-CM | POA: Diagnosis not present

## 2014-07-10 DIAGNOSIS — L408 Other psoriasis: Secondary | ICD-10-CM | POA: Diagnosis not present

## 2014-07-12 DIAGNOSIS — L408 Other psoriasis: Secondary | ICD-10-CM | POA: Diagnosis not present

## 2014-07-15 DIAGNOSIS — L408 Other psoriasis: Secondary | ICD-10-CM | POA: Diagnosis not present

## 2014-07-17 DIAGNOSIS — L408 Other psoriasis: Secondary | ICD-10-CM | POA: Diagnosis not present

## 2014-07-19 DIAGNOSIS — L408 Other psoriasis: Secondary | ICD-10-CM | POA: Diagnosis not present

## 2014-07-22 DIAGNOSIS — L408 Other psoriasis: Secondary | ICD-10-CM | POA: Diagnosis not present

## 2014-07-24 DIAGNOSIS — L408 Other psoriasis: Secondary | ICD-10-CM | POA: Diagnosis not present

## 2014-07-24 DIAGNOSIS — E785 Hyperlipidemia, unspecified: Secondary | ICD-10-CM | POA: Diagnosis not present

## 2014-07-24 DIAGNOSIS — M62838 Other muscle spasm: Secondary | ICD-10-CM | POA: Diagnosis not present

## 2014-07-24 DIAGNOSIS — E039 Hypothyroidism, unspecified: Secondary | ICD-10-CM | POA: Diagnosis not present

## 2014-07-24 DIAGNOSIS — I10 Essential (primary) hypertension: Secondary | ICD-10-CM | POA: Diagnosis not present

## 2014-07-24 DIAGNOSIS — I25810 Atherosclerosis of coronary artery bypass graft(s) without angina pectoris: Secondary | ICD-10-CM | POA: Diagnosis not present

## 2014-07-24 DIAGNOSIS — M48061 Spinal stenosis, lumbar region without neurogenic claudication: Secondary | ICD-10-CM | POA: Diagnosis not present

## 2014-07-24 DIAGNOSIS — R7301 Impaired fasting glucose: Secondary | ICD-10-CM | POA: Diagnosis not present

## 2014-07-26 DIAGNOSIS — Z4789 Encounter for other orthopedic aftercare: Secondary | ICD-10-CM | POA: Diagnosis not present

## 2014-07-26 DIAGNOSIS — L408 Other psoriasis: Secondary | ICD-10-CM | POA: Diagnosis not present

## 2014-07-29 DIAGNOSIS — L408 Other psoriasis: Secondary | ICD-10-CM | POA: Diagnosis not present

## 2014-07-31 DIAGNOSIS — L408 Other psoriasis: Secondary | ICD-10-CM | POA: Diagnosis not present

## 2014-08-02 DIAGNOSIS — L408 Other psoriasis: Secondary | ICD-10-CM | POA: Diagnosis not present

## 2014-08-05 DIAGNOSIS — L408 Other psoriasis: Secondary | ICD-10-CM | POA: Diagnosis not present

## 2014-08-07 DIAGNOSIS — L408 Other psoriasis: Secondary | ICD-10-CM | POA: Diagnosis not present

## 2014-08-09 DIAGNOSIS — L408 Other psoriasis: Secondary | ICD-10-CM | POA: Diagnosis not present

## 2014-08-12 DIAGNOSIS — L408 Other psoriasis: Secondary | ICD-10-CM | POA: Diagnosis not present

## 2014-08-14 DIAGNOSIS — L408 Other psoriasis: Secondary | ICD-10-CM | POA: Diagnosis not present

## 2014-08-16 DIAGNOSIS — L408 Other psoriasis: Secondary | ICD-10-CM | POA: Diagnosis not present

## 2014-08-19 DIAGNOSIS — L408 Other psoriasis: Secondary | ICD-10-CM | POA: Diagnosis not present

## 2014-08-21 DIAGNOSIS — L408 Other psoriasis: Secondary | ICD-10-CM | POA: Diagnosis not present

## 2014-08-23 DIAGNOSIS — L408 Other psoriasis: Secondary | ICD-10-CM | POA: Diagnosis not present

## 2014-08-28 DIAGNOSIS — L408 Other psoriasis: Secondary | ICD-10-CM | POA: Diagnosis not present

## 2014-08-30 DIAGNOSIS — L639 Alopecia areata, unspecified: Secondary | ICD-10-CM | POA: Diagnosis not present

## 2014-08-30 DIAGNOSIS — L408 Other psoriasis: Secondary | ICD-10-CM | POA: Diagnosis not present

## 2014-09-02 DIAGNOSIS — L408 Other psoriasis: Secondary | ICD-10-CM | POA: Diagnosis not present

## 2014-09-04 DIAGNOSIS — L408 Other psoriasis: Secondary | ICD-10-CM | POA: Diagnosis not present

## 2014-09-06 DIAGNOSIS — L408 Other psoriasis: Secondary | ICD-10-CM | POA: Diagnosis not present

## 2014-09-09 DIAGNOSIS — L408 Other psoriasis: Secondary | ICD-10-CM | POA: Diagnosis not present

## 2014-09-11 DIAGNOSIS — L408 Other psoriasis: Secondary | ICD-10-CM | POA: Diagnosis not present

## 2014-09-13 DIAGNOSIS — L408 Other psoriasis: Secondary | ICD-10-CM | POA: Diagnosis not present

## 2014-09-16 DIAGNOSIS — L408 Other psoriasis: Secondary | ICD-10-CM | POA: Diagnosis not present

## 2014-09-20 DIAGNOSIS — L400 Psoriasis vulgaris: Secondary | ICD-10-CM | POA: Diagnosis not present

## 2014-09-23 DIAGNOSIS — L400 Psoriasis vulgaris: Secondary | ICD-10-CM | POA: Diagnosis not present

## 2014-09-26 DIAGNOSIS — L400 Psoriasis vulgaris: Secondary | ICD-10-CM | POA: Diagnosis not present

## 2014-09-30 DIAGNOSIS — L400 Psoriasis vulgaris: Secondary | ICD-10-CM | POA: Diagnosis not present

## 2014-10-03 DIAGNOSIS — L400 Psoriasis vulgaris: Secondary | ICD-10-CM | POA: Diagnosis not present

## 2014-10-07 DIAGNOSIS — L400 Psoriasis vulgaris: Secondary | ICD-10-CM | POA: Diagnosis not present

## 2014-10-10 DIAGNOSIS — M25562 Pain in left knee: Secondary | ICD-10-CM | POA: Diagnosis not present

## 2014-10-10 DIAGNOSIS — M25561 Pain in right knee: Secondary | ICD-10-CM | POA: Diagnosis not present

## 2014-10-10 DIAGNOSIS — L400 Psoriasis vulgaris: Secondary | ICD-10-CM | POA: Diagnosis not present

## 2014-10-14 DIAGNOSIS — L400 Psoriasis vulgaris: Secondary | ICD-10-CM | POA: Diagnosis not present

## 2014-10-17 DIAGNOSIS — L400 Psoriasis vulgaris: Secondary | ICD-10-CM | POA: Diagnosis not present

## 2014-10-18 DIAGNOSIS — L400 Psoriasis vulgaris: Secondary | ICD-10-CM | POA: Diagnosis not present

## 2014-10-21 DIAGNOSIS — L400 Psoriasis vulgaris: Secondary | ICD-10-CM | POA: Diagnosis not present

## 2014-10-24 DIAGNOSIS — L400 Psoriasis vulgaris: Secondary | ICD-10-CM | POA: Diagnosis not present

## 2014-10-28 ENCOUNTER — Other Ambulatory Visit: Payer: Self-pay | Admitting: Cardiology

## 2014-10-28 DIAGNOSIS — L400 Psoriasis vulgaris: Secondary | ICD-10-CM | POA: Diagnosis not present

## 2014-10-31 DIAGNOSIS — L400 Psoriasis vulgaris: Secondary | ICD-10-CM | POA: Diagnosis not present

## 2014-11-04 DIAGNOSIS — L400 Psoriasis vulgaris: Secondary | ICD-10-CM | POA: Diagnosis not present

## 2014-11-07 DIAGNOSIS — L400 Psoriasis vulgaris: Secondary | ICD-10-CM | POA: Diagnosis not present

## 2014-11-18 DIAGNOSIS — L400 Psoriasis vulgaris: Secondary | ICD-10-CM | POA: Diagnosis not present

## 2014-11-21 DIAGNOSIS — L400 Psoriasis vulgaris: Secondary | ICD-10-CM | POA: Diagnosis not present

## 2014-11-25 DIAGNOSIS — L400 Psoriasis vulgaris: Secondary | ICD-10-CM | POA: Diagnosis not present

## 2014-11-28 DIAGNOSIS — L400 Psoriasis vulgaris: Secondary | ICD-10-CM | POA: Diagnosis not present

## 2014-12-02 DIAGNOSIS — L400 Psoriasis vulgaris: Secondary | ICD-10-CM | POA: Diagnosis not present

## 2014-12-09 DIAGNOSIS — L400 Psoriasis vulgaris: Secondary | ICD-10-CM | POA: Diagnosis not present

## 2014-12-16 DIAGNOSIS — L400 Psoriasis vulgaris: Secondary | ICD-10-CM | POA: Diagnosis not present

## 2014-12-23 DIAGNOSIS — L400 Psoriasis vulgaris: Secondary | ICD-10-CM | POA: Diagnosis not present

## 2014-12-26 DIAGNOSIS — L400 Psoriasis vulgaris: Secondary | ICD-10-CM | POA: Diagnosis not present

## 2014-12-30 DIAGNOSIS — L400 Psoriasis vulgaris: Secondary | ICD-10-CM | POA: Diagnosis not present

## 2015-01-02 DIAGNOSIS — H35373 Puckering of macula, bilateral: Secondary | ICD-10-CM | POA: Diagnosis not present

## 2015-01-02 DIAGNOSIS — L400 Psoriasis vulgaris: Secondary | ICD-10-CM | POA: Diagnosis not present

## 2015-01-02 DIAGNOSIS — H43813 Vitreous degeneration, bilateral: Secondary | ICD-10-CM | POA: Diagnosis not present

## 2015-01-02 DIAGNOSIS — H3531 Nonexudative age-related macular degeneration: Secondary | ICD-10-CM | POA: Diagnosis not present

## 2015-01-06 DIAGNOSIS — L400 Psoriasis vulgaris: Secondary | ICD-10-CM | POA: Diagnosis not present

## 2015-01-08 DIAGNOSIS — T50905A Adverse effect of unspecified drugs, medicaments and biological substances, initial encounter: Secondary | ICD-10-CM | POA: Diagnosis not present

## 2015-01-08 DIAGNOSIS — Z6827 Body mass index (BMI) 27.0-27.9, adult: Secondary | ICD-10-CM | POA: Diagnosis not present

## 2015-01-17 DIAGNOSIS — Z6827 Body mass index (BMI) 27.0-27.9, adult: Secondary | ICD-10-CM | POA: Diagnosis not present

## 2015-01-17 DIAGNOSIS — T50905D Adverse effect of unspecified drugs, medicaments and biological substances, subsequent encounter: Secondary | ICD-10-CM | POA: Diagnosis not present

## 2015-01-17 DIAGNOSIS — S0101XA Laceration without foreign body of scalp, initial encounter: Secondary | ICD-10-CM | POA: Diagnosis not present

## 2015-01-17 DIAGNOSIS — L409 Psoriasis, unspecified: Secondary | ICD-10-CM | POA: Diagnosis not present

## 2015-01-17 DIAGNOSIS — I10 Essential (primary) hypertension: Secondary | ICD-10-CM | POA: Diagnosis not present

## 2015-01-20 DIAGNOSIS — L409 Psoriasis, unspecified: Secondary | ICD-10-CM | POA: Diagnosis not present

## 2015-01-20 DIAGNOSIS — T2114XA Burn of first degree of lower back, initial encounter: Secondary | ICD-10-CM | POA: Diagnosis not present

## 2015-01-20 DIAGNOSIS — T2112XA Burn of first degree of abdominal wall, initial encounter: Secondary | ICD-10-CM | POA: Diagnosis not present

## 2015-01-24 DIAGNOSIS — H04123 Dry eye syndrome of bilateral lacrimal glands: Secondary | ICD-10-CM | POA: Diagnosis not present

## 2015-01-24 DIAGNOSIS — H02055 Trichiasis without entropian left lower eyelid: Secondary | ICD-10-CM | POA: Diagnosis not present

## 2015-01-27 NOTE — Progress Notes (Signed)
HPI: FU CAD. She had coronary artery bypass and graft in 2008. An echocardiogram in February 2010 showed normal LV function, mild left atrial enlargement, and trivial mitral regurgitation. A Myoview in February 2013 showed no ischemia or infarction and an ejection fraction of 81%. Last carotid Dopplers were performed in December of 2010. There was 0-39% bilateral stenosis. Since I last saw her,  the patient denies any dyspnea on exertion, orthopnea, PND, pedal edema, palpitations, syncope or chest pain.  Current Outpatient Prescriptions  Medication Sig Dispense Refill  . acetaminophen (TYLENOL) 325 MG tablet Take 650 mg by mouth every 4 (four) hours as needed.    Marland Kitchen aspirin 81 MG tablet Take 81 mg by mouth daily.    Marland Kitchen BIOTIN PO Take 1 tablet by mouth daily.    . cholecalciferol (VITAMIN D) 1000 UNITS tablet Take 1,000 Units by mouth daily.    . Coenzyme Q10 (CO Q 10 PO) Take 1 capsule by mouth daily. Take 3 capsules to equal 300 mg.    . docusate sodium (COLACE) 50 MG capsule Take by mouth daily as needed for mild constipation.     . fluocinonide cream (LIDEX) 4.65 % Apply 1 application topically 2 (two) times daily.     Marland Kitchen levothyroxine (SYNTHROID, LEVOTHROID) 50 MCG tablet Take 50 mcg by mouth See admin instructions. Patient takes 50 mcg on Monday Wednesday Thursday Saturday Sunday.    . levothyroxine (SYNTHROID, LEVOTHROID) 75 MCG tablet Take 75 mcg by mouth See admin instructions. Patient takes only on Tuesday and Friday    . metoprolol succinate (TOPROL-XL) 50 MG 24 hr tablet TAKE 1 TABLET BY MOUTH DAILY 90 tablet 0   No current facility-administered medications for this visit.     Past Medical History  Diagnosis Date  . Hypertension   . Hyperlipidemia   . Coronary artery disease     CABG  x4 LIMA to LAD saphenous vein graft to diagonal, saphenous vein graft to circumflex marginal, saphenous vein graft to distal RAD -- Endoscopic vein harvesting of conduit  . Hypothyroidism   .  Cerebrovascular disease   . Chronic diarrhea   . Arthritis   . Pancreatitis   . Acute cholecystitis   . Complication of anesthesia     "doesnt take a lot " medicine  . Acute renal failure (ARF)     history of    denies  . Fuch\'s endothelial dystrophy     Past Surgical History  Procedure Laterality Date  . Cardiac catheterization  02/14/2007    Three-vessel coronary artery disease with small, diffusely diseased vessels -- normal LV function with EF of 55%  . Coronary artery bypass graft  02/15/2007    x4 LIMA to LAD saphenous vein graft to diagonal, saphenous vein graft to circumflex marginal, saphenous vein graft to distal RAD -- Endoscopic vein harvesting of conduit  . Appendectomy    . Hemorrhoid surgery  10/03/2007  . Ercp w/ sphincterotomy and balloon dilation  12/26/2007  . Cholecystectomy, laparoscopic  01/28/2010    Acute cholecystitis.  . Cholecystectomy      11   . Knee arthroscopy Right 6/14  . Lumbar laminectomy/decompression microdiscectomy N/A 01/31/2014    Procedure: L3-5 DECOMPRESSION/REMOVAL OF FACET CYST;  Surgeon: Melina Schools, MD;  Location: [facility];  Service: Orthopedics;  Laterality: N/A;  . Back surgery      History   Social History  . Marital Status: Widowed    Spouse Name: N/A    Number  of Children: N/A  . Years of Education: N/A   Occupational History  . Not on file.   Social History Main Topics  . Smoking status: Former Smoker -- 0.25 packs/day for 4 years    Types: Cigarettes    Quit date: 01/25/1953  . Smokeless tobacco: Not on file  . Alcohol Use: No  . Drug Use: No  . Sexual Activity: Not on file   Other Topics Concern  . Not on file   Social History Narrative    ROS: recent burn following treatment for psoriasis but no fevers or chills, productive cough, hemoptysis, dysphasia, odynophagia, melena, hematochezia, dysuria, hematuria, rash, seizure activity, orthopnea, PND, pedal edema, claudication. Remaining systems are  negative.  Physical Exam: Well-developed well-nourished in no acute distress.  Skin with diffuse erythematous burn over her back and chest with desquamation. HEENT is normal.  Neck is supple.  Chest is clear to auscultation with normal expansion.  Cardiovascular exam is regular rate and rhythm.  Abdominal exam nontender or distended. No masses palpated. Extremities show no edema. neuro grossly intact  ECG Sinus rhythm at a rate of 80. Right bundle branch block.

## 2015-01-28 ENCOUNTER — Encounter: Payer: Self-pay | Admitting: Cardiology

## 2015-01-28 ENCOUNTER — Ambulatory Visit (INDEPENDENT_AMBULATORY_CARE_PROVIDER_SITE_OTHER): Payer: Medicare Other | Admitting: Cardiology

## 2015-01-28 VITALS — BP 130/70 | HR 80 | Ht 62.0 in | Wt 155.6 lb

## 2015-01-28 DIAGNOSIS — I10 Essential (primary) hypertension: Secondary | ICD-10-CM

## 2015-01-28 DIAGNOSIS — I25810 Atherosclerosis of coronary artery bypass graft(s) without angina pectoris: Secondary | ICD-10-CM | POA: Diagnosis not present

## 2015-01-28 DIAGNOSIS — I2510 Atherosclerotic heart disease of native coronary artery without angina pectoris: Secondary | ICD-10-CM

## 2015-01-28 DIAGNOSIS — I679 Cerebrovascular disease, unspecified: Secondary | ICD-10-CM | POA: Diagnosis not present

## 2015-01-28 DIAGNOSIS — I2583 Coronary atherosclerosis due to lipid rich plaque: Secondary | ICD-10-CM

## 2015-01-28 NOTE — Assessment & Plan Note (Signed)
Continue aspirin. Declines statins.

## 2015-01-28 NOTE — Assessment & Plan Note (Signed)
Continue diet. Declines statins. 

## 2015-01-28 NOTE — Patient Instructions (Signed)
Your physician wants you to follow-up in: ONE YEAR WITH DR CRENSHAW You will receive a reminder letter in the mail two months in advance. If you don't receive a letter, please call our office to schedule the follow-up appointment.  

## 2015-01-28 NOTE — Assessment & Plan Note (Signed)
Blood pressure controlled. Continue present medications. 

## 2015-02-02 ENCOUNTER — Other Ambulatory Visit: Payer: Self-pay | Admitting: Cardiology

## 2015-02-15 ENCOUNTER — Other Ambulatory Visit: Payer: Self-pay | Admitting: Cardiology

## 2015-02-19 DIAGNOSIS — I10 Essential (primary) hypertension: Secondary | ICD-10-CM | POA: Diagnosis not present

## 2015-02-19 DIAGNOSIS — Z6827 Body mass index (BMI) 27.0-27.9, adult: Secondary | ICD-10-CM | POA: Diagnosis not present

## 2015-02-19 DIAGNOSIS — R252 Cramp and spasm: Secondary | ICD-10-CM | POA: Diagnosis not present

## 2015-02-28 DIAGNOSIS — L409 Psoriasis, unspecified: Secondary | ICD-10-CM | POA: Diagnosis not present

## 2015-02-28 DIAGNOSIS — T2112XD Burn of first degree of abdominal wall, subsequent encounter: Secondary | ICD-10-CM | POA: Diagnosis not present

## 2015-02-28 DIAGNOSIS — T2114XD Burn of first degree of lower back, subsequent encounter: Secondary | ICD-10-CM | POA: Diagnosis not present

## 2015-03-31 DIAGNOSIS — S8001XA Contusion of right knee, initial encounter: Secondary | ICD-10-CM | POA: Diagnosis not present

## 2015-03-31 DIAGNOSIS — M25512 Pain in left shoulder: Secondary | ICD-10-CM | POA: Diagnosis not present

## 2015-04-05 DIAGNOSIS — M25512 Pain in left shoulder: Secondary | ICD-10-CM | POA: Diagnosis not present

## 2015-04-08 DIAGNOSIS — L409 Psoriasis, unspecified: Secondary | ICD-10-CM | POA: Diagnosis not present

## 2015-04-10 DIAGNOSIS — N3281 Overactive bladder: Secondary | ICD-10-CM | POA: Diagnosis not present

## 2015-04-10 DIAGNOSIS — I25810 Atherosclerosis of coronary artery bypass graft(s) without angina pectoris: Secondary | ICD-10-CM | POA: Diagnosis not present

## 2015-04-10 DIAGNOSIS — F439 Reaction to severe stress, unspecified: Secondary | ICD-10-CM | POA: Diagnosis not present

## 2015-04-10 DIAGNOSIS — Z6827 Body mass index (BMI) 27.0-27.9, adult: Secondary | ICD-10-CM | POA: Diagnosis not present

## 2015-04-10 DIAGNOSIS — R7301 Impaired fasting glucose: Secondary | ICD-10-CM | POA: Diagnosis not present

## 2015-04-10 DIAGNOSIS — M5380 Other specified dorsopathies, site unspecified: Secondary | ICD-10-CM | POA: Diagnosis not present

## 2015-04-10 DIAGNOSIS — E039 Hypothyroidism, unspecified: Secondary | ICD-10-CM | POA: Diagnosis not present

## 2015-04-10 DIAGNOSIS — E785 Hyperlipidemia, unspecified: Secondary | ICD-10-CM | POA: Diagnosis not present

## 2015-04-10 DIAGNOSIS — I10 Essential (primary) hypertension: Secondary | ICD-10-CM | POA: Diagnosis not present

## 2015-04-11 DIAGNOSIS — I10 Essential (primary) hypertension: Secondary | ICD-10-CM | POA: Diagnosis not present

## 2015-04-11 DIAGNOSIS — N390 Urinary tract infection, site not specified: Secondary | ICD-10-CM | POA: Diagnosis not present

## 2015-04-11 DIAGNOSIS — R8299 Other abnormal findings in urine: Secondary | ICD-10-CM | POA: Diagnosis not present

## 2015-04-15 DIAGNOSIS — M75122 Complete rotator cuff tear or rupture of left shoulder, not specified as traumatic: Secondary | ICD-10-CM | POA: Diagnosis not present

## 2015-04-15 DIAGNOSIS — M25562 Pain in left knee: Secondary | ICD-10-CM | POA: Diagnosis not present

## 2015-04-16 DIAGNOSIS — R7301 Impaired fasting glucose: Secondary | ICD-10-CM | POA: Diagnosis not present

## 2015-04-28 DIAGNOSIS — Z23 Encounter for immunization: Secondary | ICD-10-CM | POA: Diagnosis not present

## 2015-04-29 DIAGNOSIS — M75122 Complete rotator cuff tear or rupture of left shoulder, not specified as traumatic: Secondary | ICD-10-CM | POA: Diagnosis not present

## 2015-05-28 DIAGNOSIS — L409 Psoriasis, unspecified: Secondary | ICD-10-CM | POA: Diagnosis not present

## 2015-08-01 DIAGNOSIS — L409 Psoriasis, unspecified: Secondary | ICD-10-CM | POA: Diagnosis not present

## 2015-08-05 DIAGNOSIS — Z6827 Body mass index (BMI) 27.0-27.9, adult: Secondary | ICD-10-CM | POA: Diagnosis not present

## 2015-08-05 DIAGNOSIS — I10 Essential (primary) hypertension: Secondary | ICD-10-CM | POA: Diagnosis not present

## 2015-08-05 DIAGNOSIS — E039 Hypothyroidism, unspecified: Secondary | ICD-10-CM | POA: Diagnosis not present

## 2015-08-05 DIAGNOSIS — L409 Psoriasis, unspecified: Secondary | ICD-10-CM | POA: Diagnosis not present

## 2015-09-16 DIAGNOSIS — L409 Psoriasis, unspecified: Secondary | ICD-10-CM | POA: Diagnosis not present

## 2015-09-16 DIAGNOSIS — Z5181 Encounter for therapeutic drug level monitoring: Secondary | ICD-10-CM | POA: Diagnosis not present

## 2015-10-15 DIAGNOSIS — L409 Psoriasis, unspecified: Secondary | ICD-10-CM | POA: Diagnosis not present

## 2015-11-28 DIAGNOSIS — E784 Other hyperlipidemia: Secondary | ICD-10-CM | POA: Diagnosis not present

## 2015-11-28 DIAGNOSIS — R8299 Other abnormal findings in urine: Secondary | ICD-10-CM | POA: Diagnosis not present

## 2015-11-28 DIAGNOSIS — I10 Essential (primary) hypertension: Secondary | ICD-10-CM | POA: Diagnosis not present

## 2015-11-28 DIAGNOSIS — E038 Other specified hypothyroidism: Secondary | ICD-10-CM | POA: Diagnosis not present

## 2015-11-28 DIAGNOSIS — R7301 Impaired fasting glucose: Secondary | ICD-10-CM | POA: Diagnosis not present

## 2015-11-28 DIAGNOSIS — N390 Urinary tract infection, site not specified: Secondary | ICD-10-CM | POA: Diagnosis not present

## 2015-12-04 IMAGING — CR DG LUMBAR SPINE 1V
1 series · 1 of 1 positions shown · non-contrast
Comparison: Film from earlier in the same day

CLINICAL DATA: Lumbar decompression

EXAM:
LUMBAR SPINE - 1 VIEW

[AP]
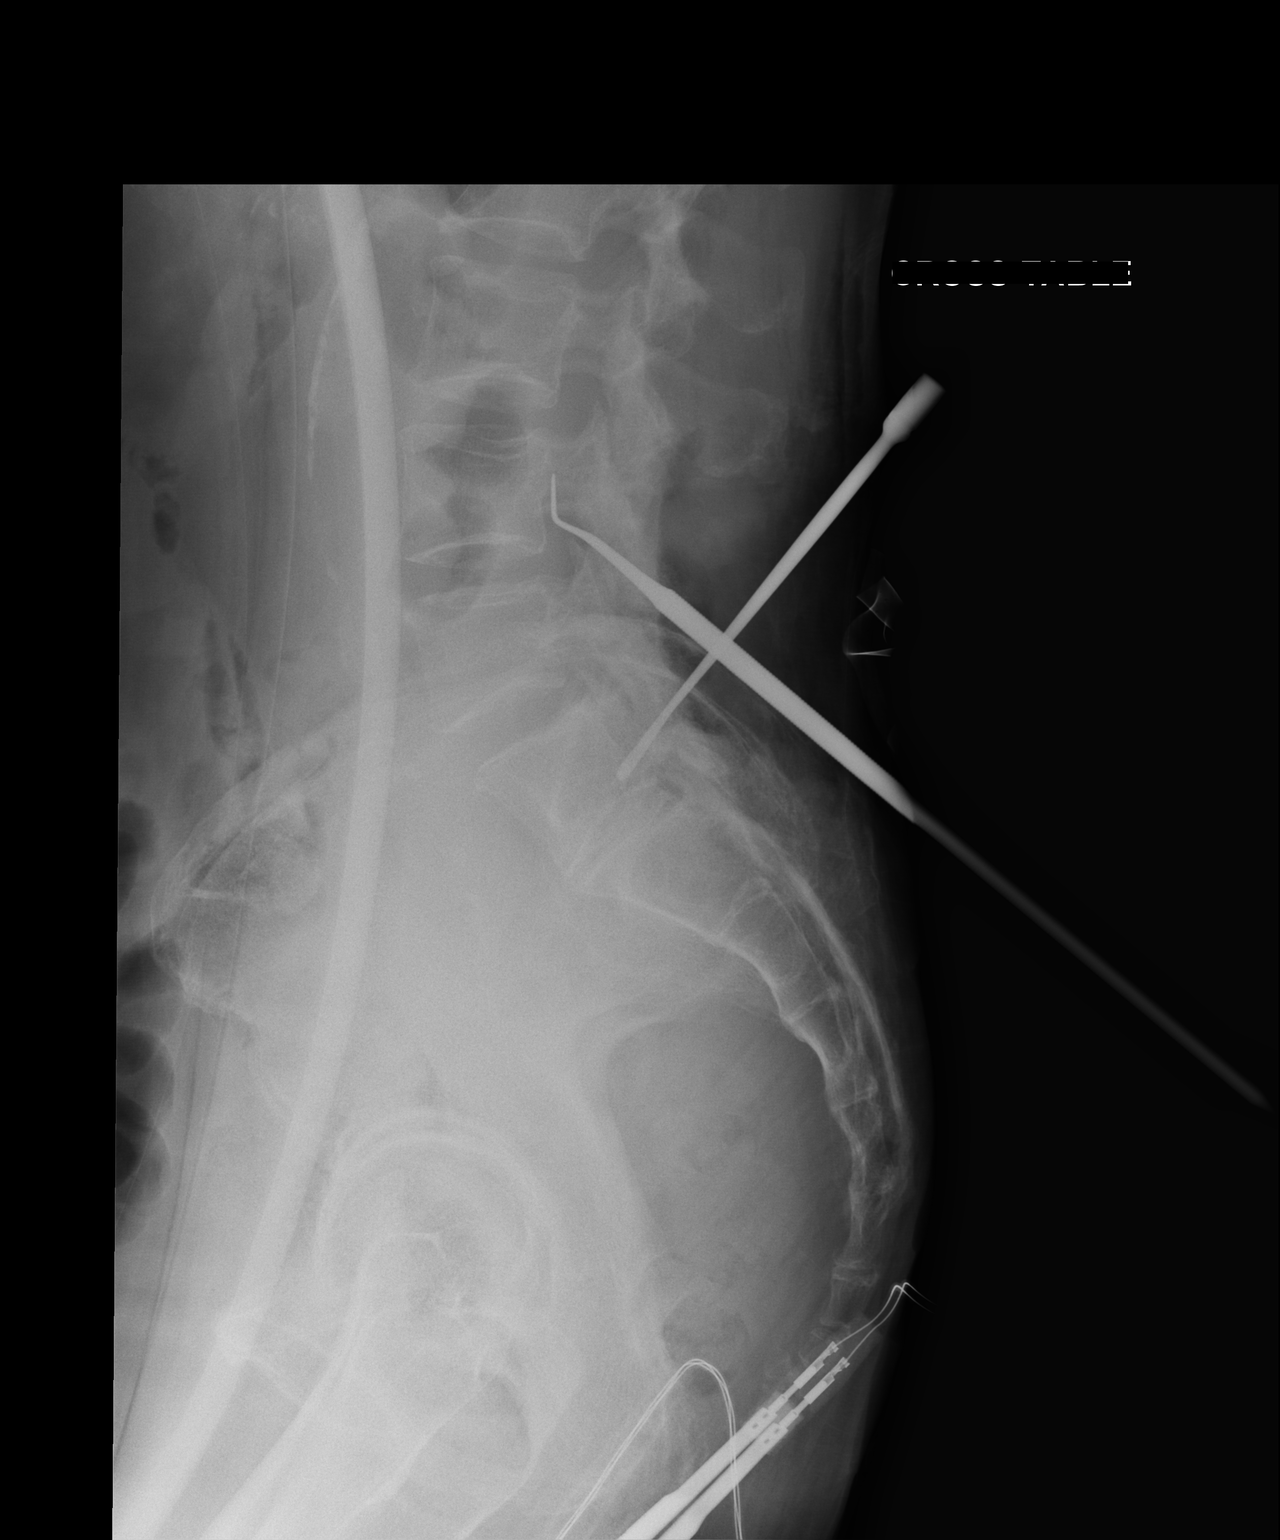

[1 of 1 positions shown; findings below may reference images not displayed]

FINDINGS: Surgical instruments are now noted overlying the knees spinal canal
at the L4 and S1 levels. Correlation with the operative findings is
recommended. The numbering nomenclature is similar to that used on
the previous film.

## 2015-12-04 IMAGING — CR DG LUMBAR SPINE 2-3V
2 series · 2 of 2 positions shown · non-contrast
Comparison: CT Abdomen and Pelvis 02/05/2010. Chest radiographs
01/25/2014.

CLINICAL DATA: 85-year-old female undergoing lumbar surgery.
Initial encounter.

EXAM:
LUMBAR SPINE - 2-3 VIEW

[lateral (1 of 2)]
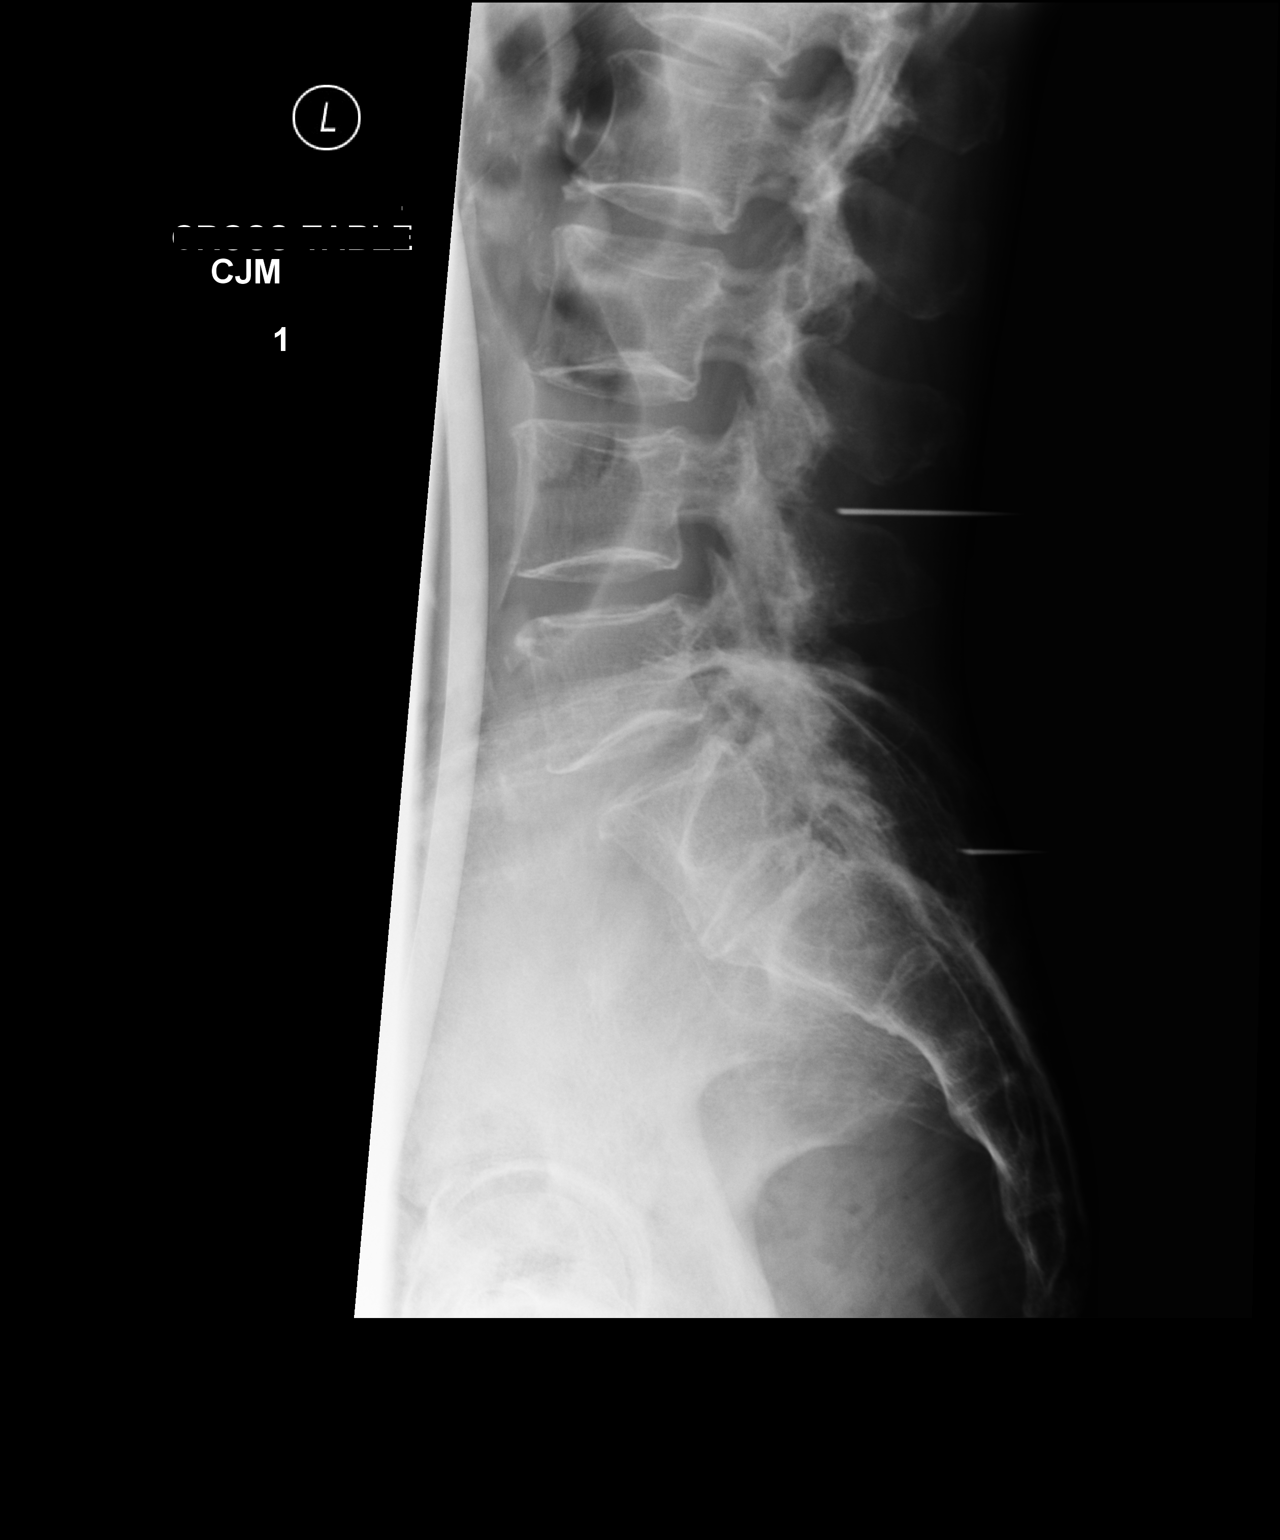

[lateral (2 of 2)]
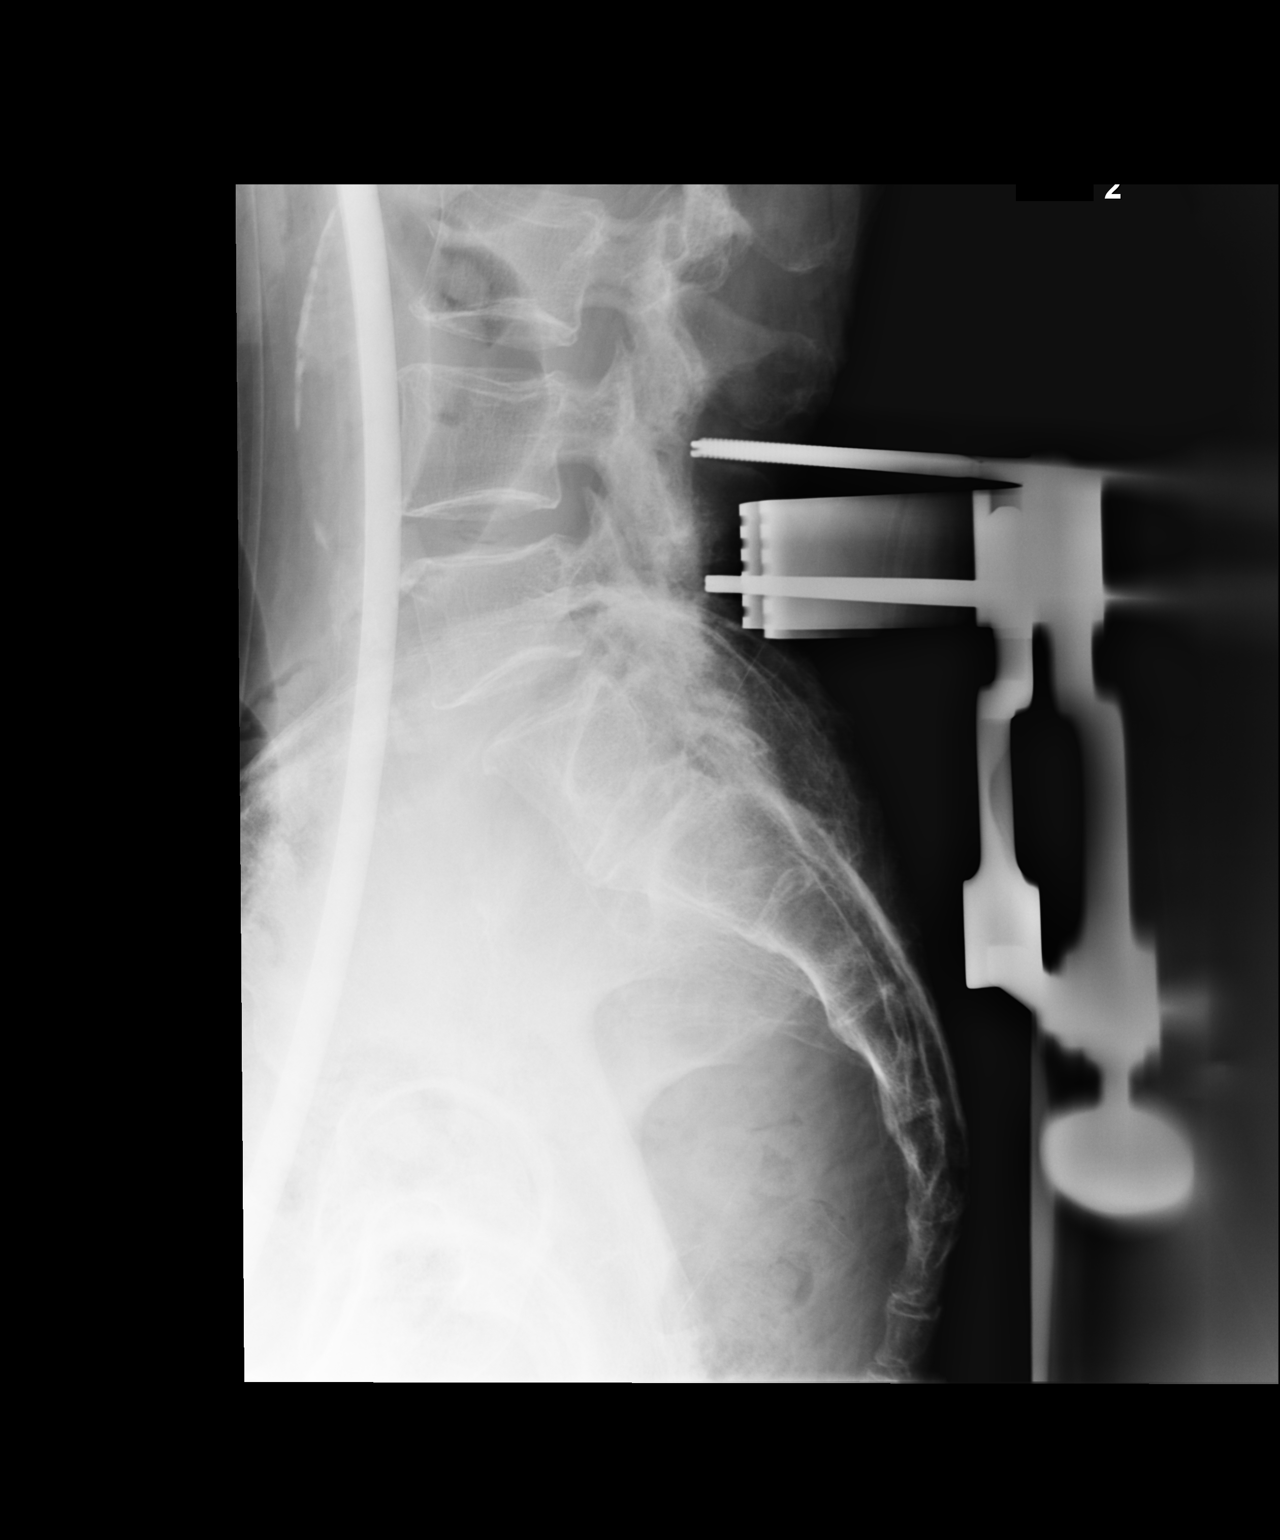

[2 of 2 positions shown; findings below may reference images not displayed]

FINDINGS: Twelve full size ribs on knee 01/25/2014 comparison. Subsequently
02/05/2010 study confirms vestigial S1-S2 disc space. Chronic mild
grade 1 anterolisthesis of L5 on S1 by this numbering system.

Intraoperative portable cross-table lateral view of the lumbar spine
labeled film 1 at 5508 hrs. Cephalad needle directed at the L4
pedicle level. Caudal needle directed at the vestigial S1-S2 disc
space.

Intraoperative portable cross-table lateral view of the lumbar spine
labeled film 2 at 8681 hrs. Cephalad clamp directed toward the L4
pedicle. Caudal clamp directed at the L5 pedicle.
IMPRESSION: Intraoperative localization as above. The numbering system on this
study was discussed and confirmed by telephone with Dr. KEN
MAY in the OR on 01/31/2014 at [DATE] .

## 2015-12-05 DIAGNOSIS — I10 Essential (primary) hypertension: Secondary | ICD-10-CM | POA: Diagnosis not present

## 2015-12-05 DIAGNOSIS — Z1389 Encounter for screening for other disorder: Secondary | ICD-10-CM | POA: Diagnosis not present

## 2015-12-05 DIAGNOSIS — E038 Other specified hypothyroidism: Secondary | ICD-10-CM | POA: Diagnosis not present

## 2015-12-05 DIAGNOSIS — L409 Psoriasis, unspecified: Secondary | ICD-10-CM | POA: Diagnosis not present

## 2015-12-05 DIAGNOSIS — E784 Other hyperlipidemia: Secondary | ICD-10-CM | POA: Diagnosis not present

## 2015-12-05 DIAGNOSIS — Z6827 Body mass index (BMI) 27.0-27.9, adult: Secondary | ICD-10-CM | POA: Diagnosis not present

## 2015-12-05 DIAGNOSIS — I25810 Atherosclerosis of coronary artery bypass graft(s) without angina pectoris: Secondary | ICD-10-CM | POA: Diagnosis not present

## 2015-12-05 DIAGNOSIS — R7301 Impaired fasting glucose: Secondary | ICD-10-CM | POA: Diagnosis not present

## 2015-12-05 DIAGNOSIS — Z0000 Encounter for general adult medical examination without abnormal findings: Secondary | ICD-10-CM | POA: Diagnosis not present

## 2015-12-05 DIAGNOSIS — M4806 Spinal stenosis, lumbar region: Secondary | ICD-10-CM | POA: Diagnosis not present

## 2015-12-05 DIAGNOSIS — N3281 Overactive bladder: Secondary | ICD-10-CM | POA: Diagnosis not present

## 2015-12-25 ENCOUNTER — Telehealth: Payer: Self-pay | Admitting: Cardiology

## 2015-12-26 NOTE — Telephone Encounter (Signed)
Close encounter 

## 2016-01-01 DIAGNOSIS — M25552 Pain in left hip: Secondary | ICD-10-CM | POA: Diagnosis not present

## 2016-01-06 DIAGNOSIS — H353112 Nonexudative age-related macular degeneration, right eye, intermediate dry stage: Secondary | ICD-10-CM | POA: Diagnosis not present

## 2016-01-06 DIAGNOSIS — H35373 Puckering of macula, bilateral: Secondary | ICD-10-CM | POA: Diagnosis not present

## 2016-01-06 DIAGNOSIS — H353122 Nonexudative age-related macular degeneration, left eye, intermediate dry stage: Secondary | ICD-10-CM | POA: Diagnosis not present

## 2016-01-06 DIAGNOSIS — H43813 Vitreous degeneration, bilateral: Secondary | ICD-10-CM | POA: Diagnosis not present

## 2016-01-07 DIAGNOSIS — M25552 Pain in left hip: Secondary | ICD-10-CM | POA: Diagnosis not present

## 2016-01-07 DIAGNOSIS — M6281 Muscle weakness (generalized): Secondary | ICD-10-CM | POA: Diagnosis not present

## 2016-01-08 DIAGNOSIS — M25552 Pain in left hip: Secondary | ICD-10-CM | POA: Diagnosis not present

## 2016-01-08 DIAGNOSIS — M6281 Muscle weakness (generalized): Secondary | ICD-10-CM | POA: Diagnosis not present

## 2016-01-12 DIAGNOSIS — M25552 Pain in left hip: Secondary | ICD-10-CM | POA: Diagnosis not present

## 2016-01-12 DIAGNOSIS — M6281 Muscle weakness (generalized): Secondary | ICD-10-CM | POA: Diagnosis not present

## 2016-01-14 DIAGNOSIS — M25552 Pain in left hip: Secondary | ICD-10-CM | POA: Diagnosis not present

## 2016-01-14 DIAGNOSIS — M6281 Muscle weakness (generalized): Secondary | ICD-10-CM | POA: Diagnosis not present

## 2016-01-15 DIAGNOSIS — L409 Psoriasis, unspecified: Secondary | ICD-10-CM | POA: Diagnosis not present

## 2016-01-15 DIAGNOSIS — Z79899 Other long term (current) drug therapy: Secondary | ICD-10-CM | POA: Diagnosis not present

## 2016-01-15 DIAGNOSIS — Z23 Encounter for immunization: Secondary | ICD-10-CM | POA: Diagnosis not present

## 2016-01-16 DIAGNOSIS — M6281 Muscle weakness (generalized): Secondary | ICD-10-CM | POA: Diagnosis not present

## 2016-01-16 DIAGNOSIS — M25552 Pain in left hip: Secondary | ICD-10-CM | POA: Diagnosis not present

## 2016-01-16 NOTE — Progress Notes (Signed)
HPI: FU CAD. She had coronary artery bypass and graft in 2008. An echocardiogram in February 2010 showed normal LV function, mild left atrial enlargement, and trivial mitral regurgitation. A Myoview in February 2013 showed no ischemia or infarction and an ejection fraction of 81%. Last carotid Dopplers were performed in December of 2010. There was 0-39% bilateral stenosis. Since I last saw her,the patient denies any dyspnea on exertion, orthopnea, PND, pedal edema, palpitations, syncope or chest pain.    Current Outpatient Prescriptions  Medication Sig Dispense Refill  . acetaminophen (TYLENOL) 325 MG tablet Take 650 mg by mouth every 4 (four) hours as needed.    Marland Kitchen aspirin 81 MG tablet Take 81 mg by mouth daily.    Marland Kitchen BIOTIN PO Take 1 tablet by mouth daily.    . cholecalciferol (VITAMIN D) 1000 UNITS tablet Take 1,000 Units by mouth daily.    . Coenzyme Q10 (CO Q 10 PO) Take 1 capsule by mouth daily. Take 3 capsules to equal 300 mg.    . docusate sodium (COLACE) 50 MG capsule Take by mouth daily as needed for mild constipation.     . fluocinonide cream (LIDEX) [id number] % Apply 1 application topically 2 (two) times daily.     Marland Kitchen levothyroxine (SYNTHROID, LEVOTHROID) 50 MCG tablet Take 50 mcg by mouth See admin instructions. Patient takes 50 mcg on Monday Wednesday Thursday Saturday Sunday.    . levothyroxine (SYNTHROID, LEVOTHROID) 75 MCG tablet Take 75 mcg by mouth See admin instructions. Patient takes only on Tuesday and Friday    . metoprolol succinate (TOPROL-XL) 50 MG 24 hr tablet TAKE 1 TABLET BY MOUTH EVERY DAY. 90 tablet 4  . OTEZLA 30 MG TABS Apply 30 mg topically 2 (two) times daily.  0   No current facility-administered medications for this visit.     Past Medical History  Diagnosis Date  . Hypertension   . Hyperlipidemia   . Coronary artery disease     CABG  x4 LIMA to LAD saphenous vein graft to diagonal, saphenous vein graft to circumflex marginal, saphenous vein graft to  distal RAD -- Endoscopic vein harvesting of conduit  . Hypothyroidism   . Cerebrovascular disease   . Chronic diarrhea   . Arthritis   . Pancreatitis   . Acute cholecystitis   . Complication of anesthesia     "doesnt take a lot " medicine  . Acute renal failure (ARF) (HCC)     history of    denies  . Fuch\'s endothelial dystrophy     Past Surgical History  Procedure Laterality Date  . Cardiac catheterization  02/14/2007    Three-vessel coronary artery disease with small, diffusely diseased vessels -- normal LV function with EF of 55%  . Coronary artery bypass graft  02/15/2007    x4 LIMA to LAD saphenous vein graft to diagonal, saphenous vein graft to circumflex marginal, saphenous vein graft to distal RAD -- Endoscopic vein harvesting of conduit  . Appendectomy    . Hemorrhoid surgery  10/03/2007  . Ercp w/ sphincterotomy and balloon dilation  12/26/2007  . Cholecystectomy, laparoscopic  01/28/2010    Acute cholecystitis.  . Cholecystectomy      11   . Knee arthroscopy Right 6/14  . Lumbar laminectomy/decompression microdiscectomy N/A 01/31/2014    Procedure: L3-5 DECOMPRESSION/REMOVAL OF FACET CYST;  Surgeon: Melina Schools, MD;  Location: [facility];  Service: Orthopedics;  Laterality: N/A;  . Back surgery      Social History  Social History  . Marital Status: Widowed    Spouse Name: N/A  . Number of Children: N/A  . Years of Education: N/A   Occupational History  . Not on file.   Social History Main Topics  . Smoking status: Former Smoker -- 0.25 packs/day for 4 years    Types: Cigarettes    Quit date: 01/25/1953  . Smokeless tobacco: Not on file  . Alcohol Use: No  . Drug Use: No  . Sexual Activity: Not on file   Other Topics Concern  . Not on file   Social History Narrative    Family History  Problem Relation Age of Onset  . Heart attack Mother 83  . Heart attack Father 67    heavy smoker    ROS: psoriasis but no fevers or chills, productive cough,  hemoptysis, dysphasia, odynophagia, melena, hematochezia, dysuria, hematuria, rash, seizure activity, orthopnea, PND, pedal edema, claudication. Remaining systems are negative.  Physical Exam: Well-developed well-nourished in no acute distress.  Skin is warm and dry. Rash on extremities and back from psoriasis HEENT is normal.  Neck is supple.  Chest is clear to auscultation with normal expansion.  Cardiovascular exam is regular rate and rhythm.  Abdominal exam nontender or distended. No masses palpated. Extremities show no edema. neuro grossly intact  ECG Normal sinus rhythm, First-degree AV block, right bundle branch block.

## 2016-01-19 DIAGNOSIS — M25552 Pain in left hip: Secondary | ICD-10-CM | POA: Diagnosis not present

## 2016-01-19 DIAGNOSIS — M6281 Muscle weakness (generalized): Secondary | ICD-10-CM | POA: Diagnosis not present

## 2016-01-21 DIAGNOSIS — M25552 Pain in left hip: Secondary | ICD-10-CM | POA: Diagnosis not present

## 2016-01-21 DIAGNOSIS — M6281 Muscle weakness (generalized): Secondary | ICD-10-CM | POA: Diagnosis not present

## 2016-01-23 ENCOUNTER — Ambulatory Visit (INDEPENDENT_AMBULATORY_CARE_PROVIDER_SITE_OTHER): Payer: Medicare Other | Admitting: Cardiology

## 2016-01-23 ENCOUNTER — Encounter: Payer: Self-pay | Admitting: Cardiology

## 2016-01-23 VITALS — BP 100/60 | HR 72 | Ht 62.0 in | Wt 152.0 lb

## 2016-01-23 DIAGNOSIS — I679 Cerebrovascular disease, unspecified: Secondary | ICD-10-CM | POA: Diagnosis not present

## 2016-01-23 DIAGNOSIS — I10 Essential (primary) hypertension: Secondary | ICD-10-CM | POA: Diagnosis not present

## 2016-01-23 DIAGNOSIS — M6281 Muscle weakness (generalized): Secondary | ICD-10-CM | POA: Diagnosis not present

## 2016-01-23 DIAGNOSIS — I2510 Atherosclerotic heart disease of native coronary artery without angina pectoris: Secondary | ICD-10-CM

## 2016-01-23 DIAGNOSIS — M25552 Pain in left hip: Secondary | ICD-10-CM | POA: Diagnosis not present

## 2016-01-23 DIAGNOSIS — I2583 Coronary atherosclerosis due to lipid rich plaque: Principal | ICD-10-CM

## 2016-01-23 NOTE — Assessment & Plan Note (Signed)
Continue aspirin. Declines statins. 

## 2016-01-23 NOTE — Patient Instructions (Signed)
Your physician wants you to follow-up in: ONE YEAR WITH DR CRENSHAW You will receive a reminder letter in the mail two months in advance. If you don't receive a letter, please call our office to schedule the follow-up appointment.   If you need a refill on your cardiac medications before your next appointment, please call your pharmacy.  

## 2016-01-23 NOTE — Assessment & Plan Note (Signed)
Continue diet. Declines statins. 

## 2016-01-23 NOTE — Assessment & Plan Note (Signed)
Blood pressure controlled. Continue present medications. 

## 2016-02-02 DIAGNOSIS — Z79899 Other long term (current) drug therapy: Secondary | ICD-10-CM | POA: Diagnosis not present

## 2016-02-02 DIAGNOSIS — L409 Psoriasis, unspecified: Secondary | ICD-10-CM | POA: Diagnosis not present

## 2016-02-09 DIAGNOSIS — M25552 Pain in left hip: Secondary | ICD-10-CM | POA: Diagnosis not present

## 2016-02-09 DIAGNOSIS — M6281 Muscle weakness (generalized): Secondary | ICD-10-CM | POA: Diagnosis not present

## 2016-02-23 IMAGING — CR DG HAND COMPLETE 3+V*R*
3 series · 3 of 3 positions shown · non-contrast
Comparison: None.

CLINICAL DATA: Fall with bruising and swelling about fifth
metacarpal.

EXAM:
RIGHT HAND - COMPLETE 3+ VIEW

[x hand pa right]
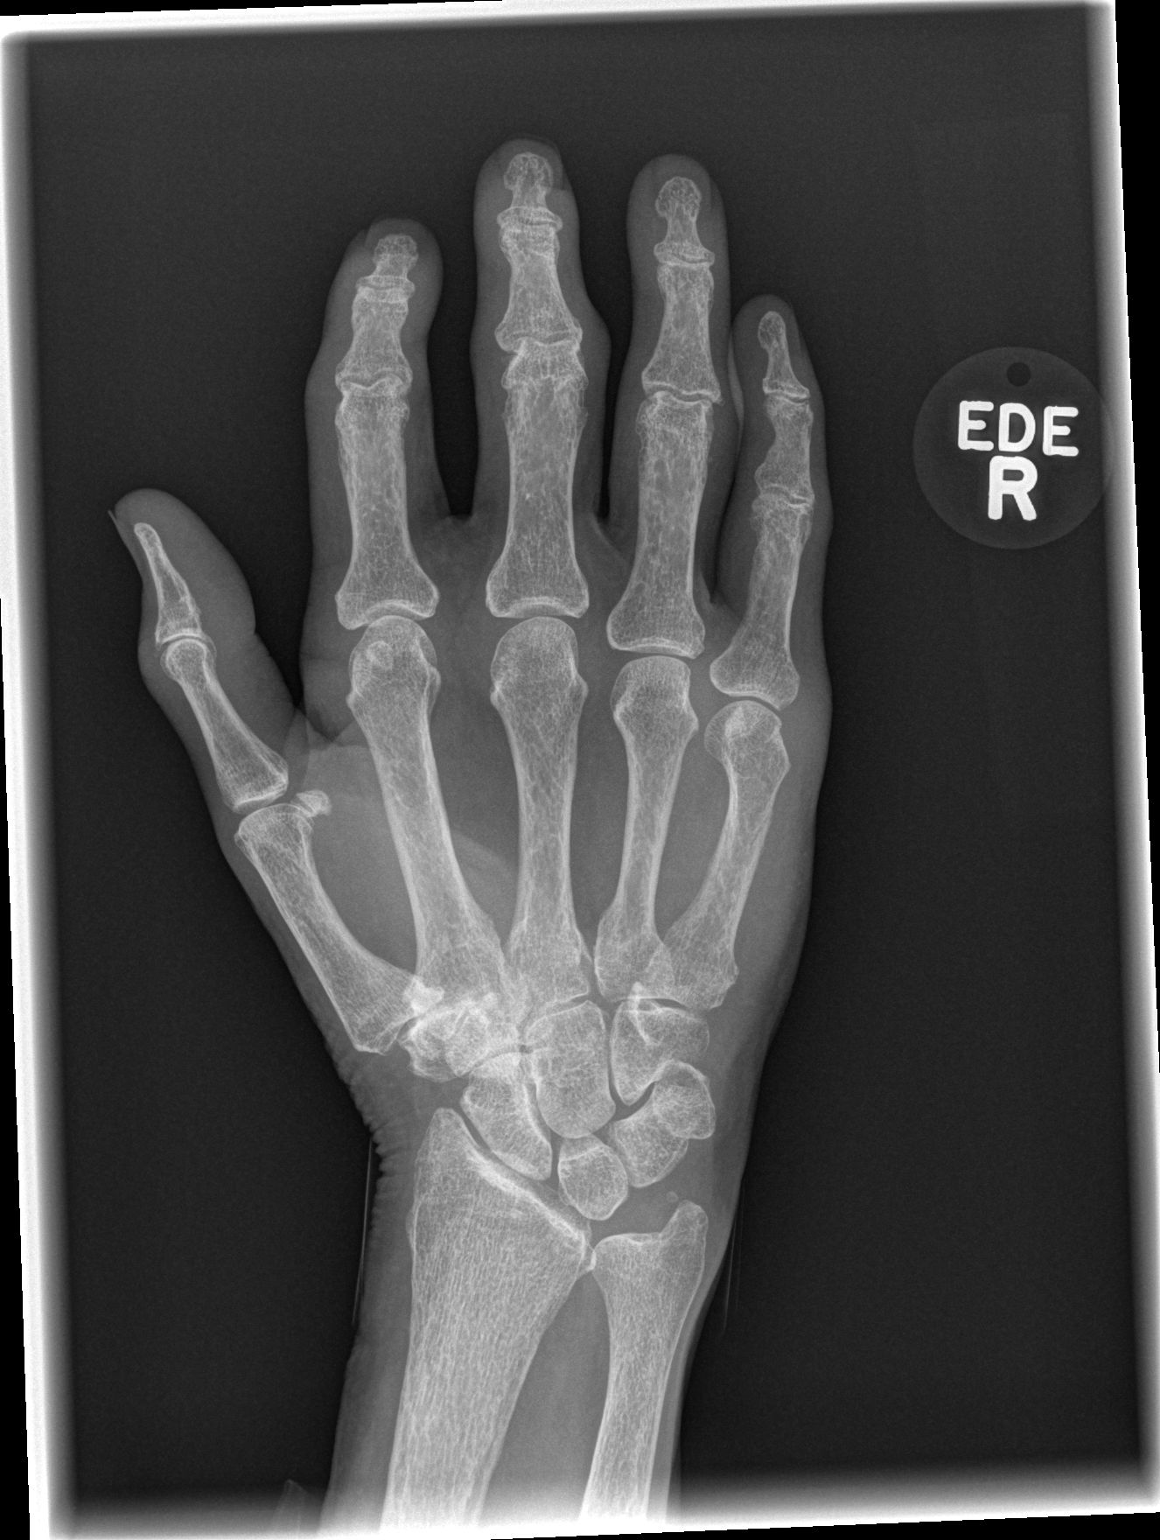

[x hand oblique right]
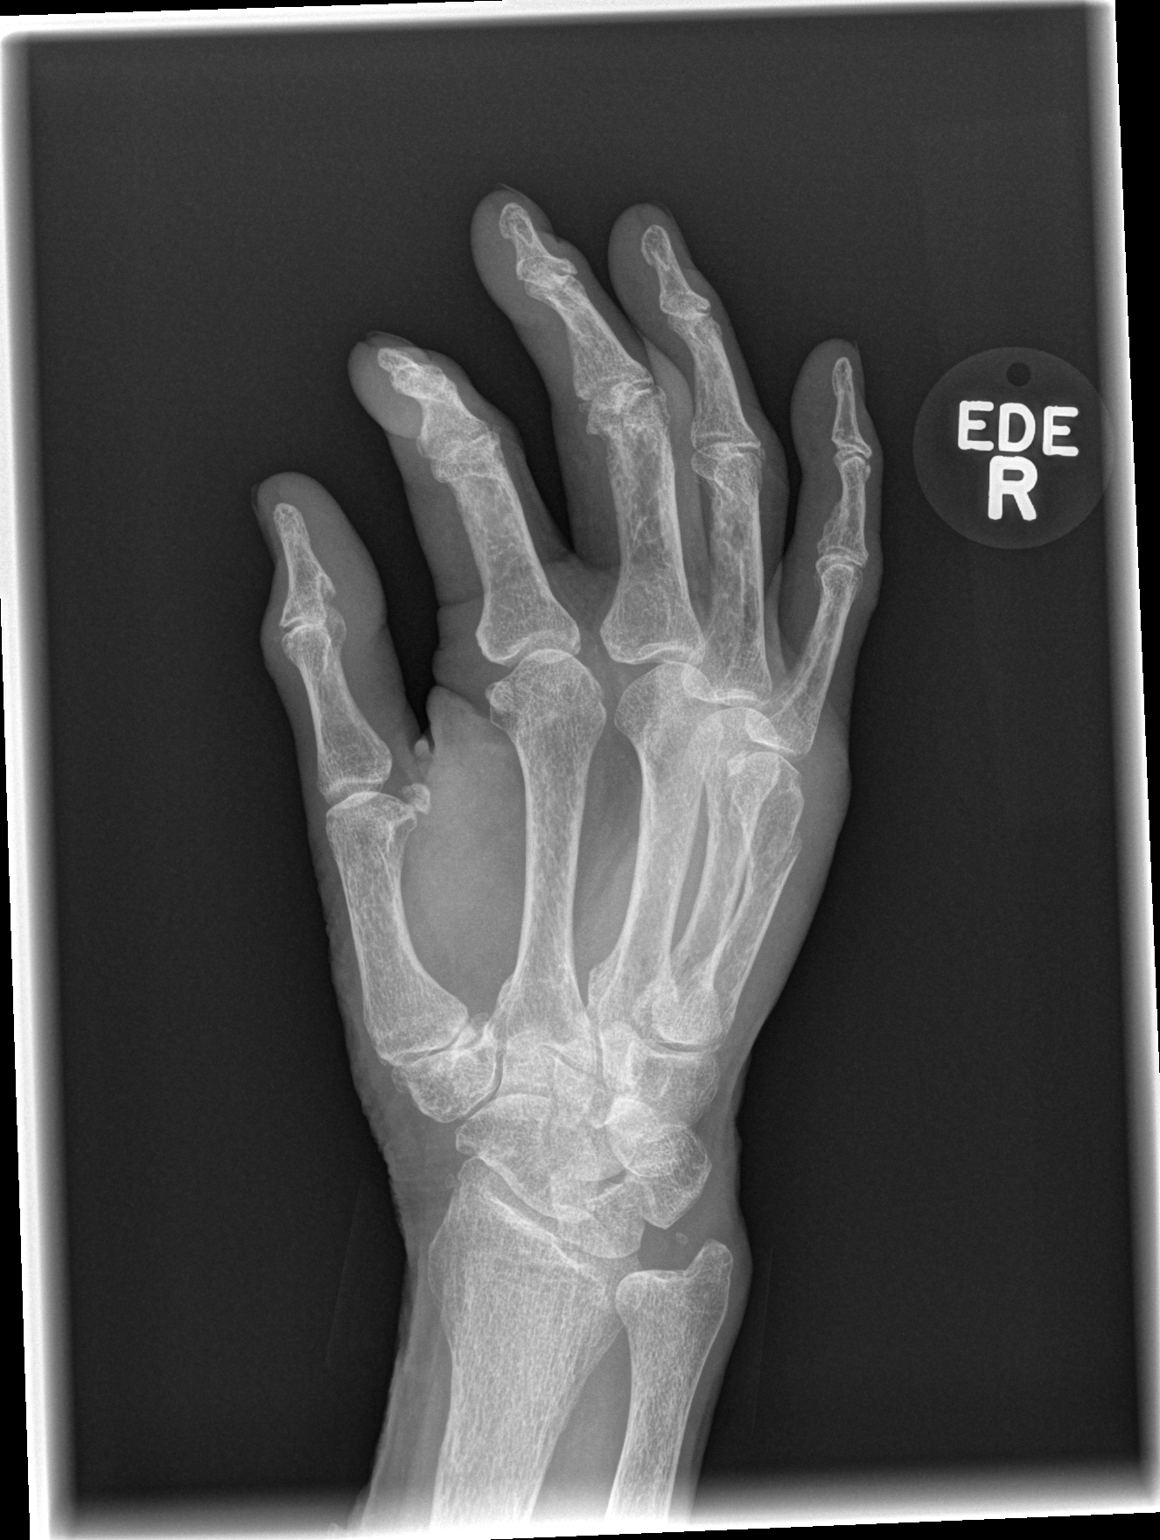

[x hand lat right]
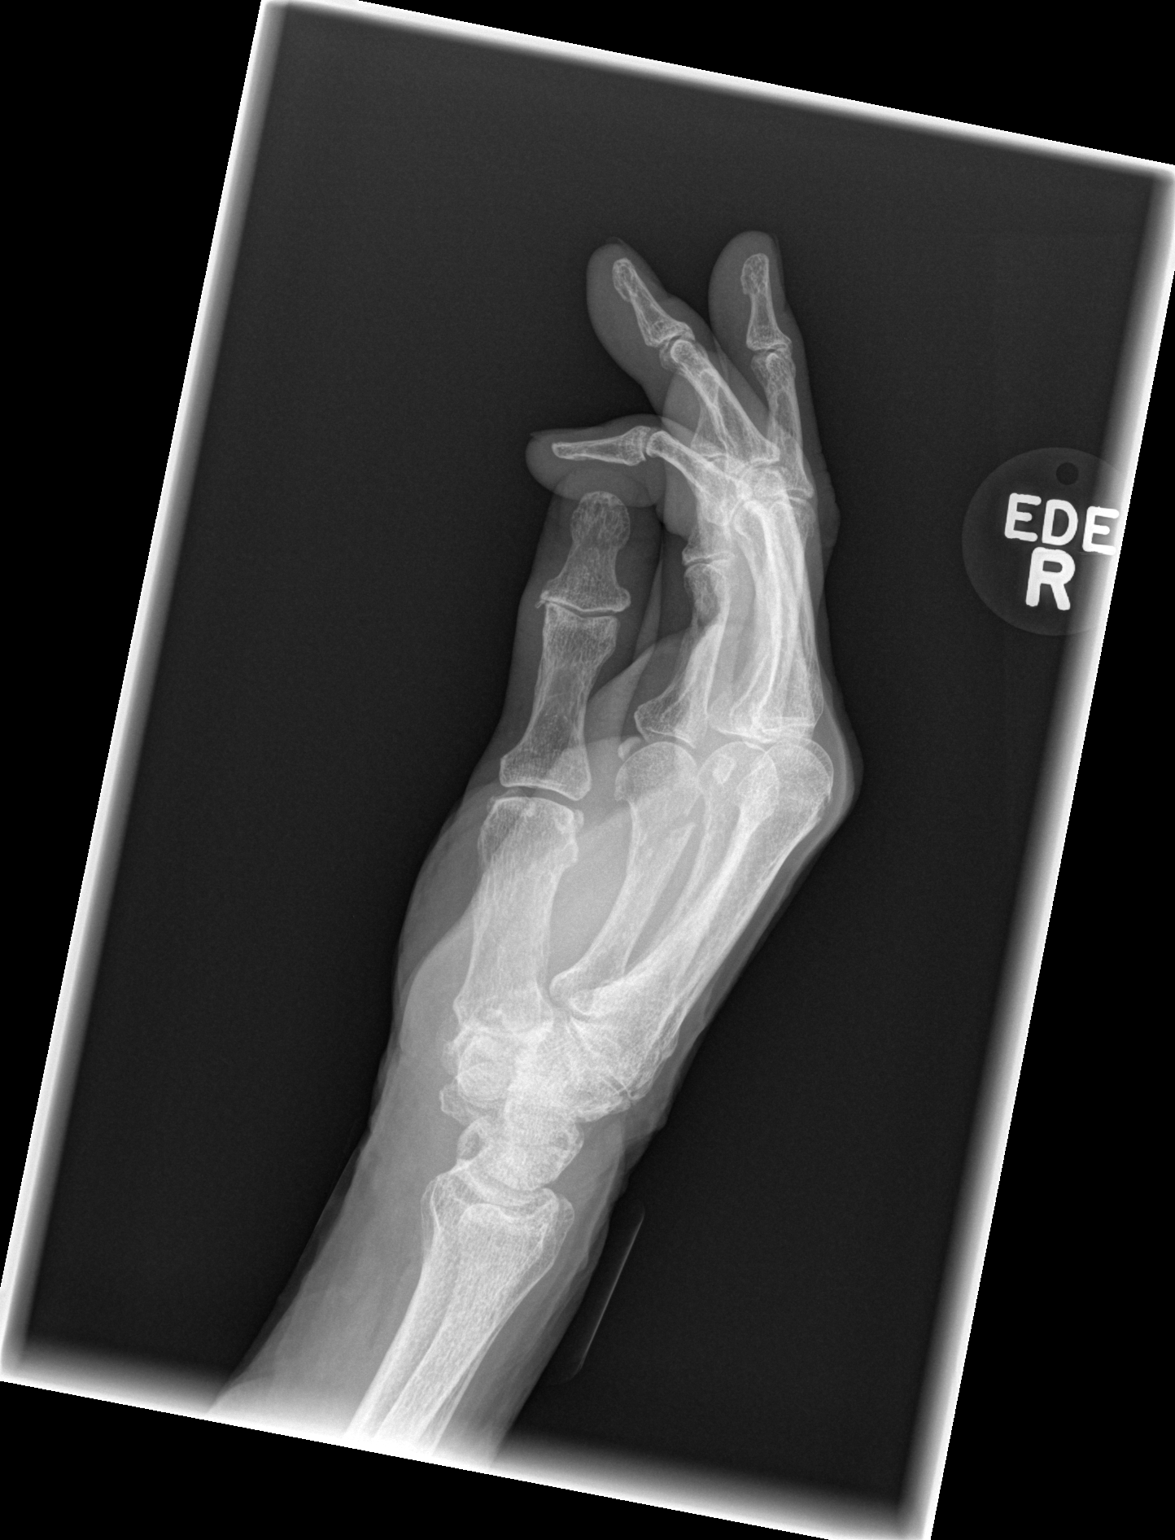

[3 of 3 positions shown; findings below may reference images not displayed]

FINDINGS: Osteopenia. Scattered degenerative changes. Likely remote ulnar
styloid fracture. Extensive overlap of digits on below lateral view.
Oblique, mildly displaced fracture of the distal shaft of the fifth
metacarpal. No intra-articular extension.
IMPRESSION: Fifth metacarpal shaft fracture.

## 2016-02-23 IMAGING — CT CT HEAD W/O CM
1 series · 16 of 30 positions shown, 20 images · non-contrast
Comparison: None.

CLINICAL DATA: Fall, head laceration

EXAM:
CT HEAD WITHOUT CONTRAST
TECHNIQUE: Contiguous axial images were obtained from the base of the skull
through the vertex without intravenous contrast.

[Series 2: head 5.0 h30s · axial · 0.41mm/px · z∈[-174,-39]mm · 16 of 31 slices shown, 20 images]
[im 2/31  brain]
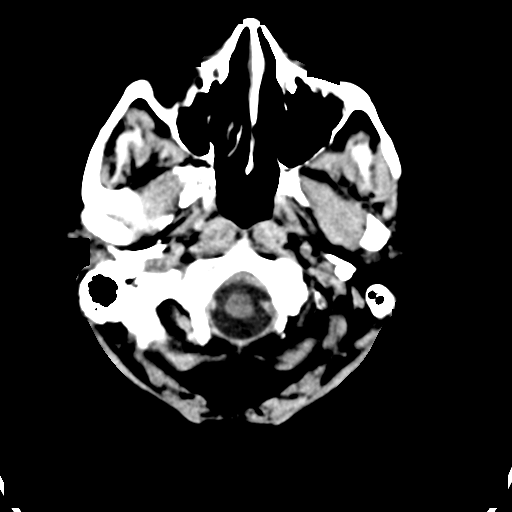
[im 2/31  bone]
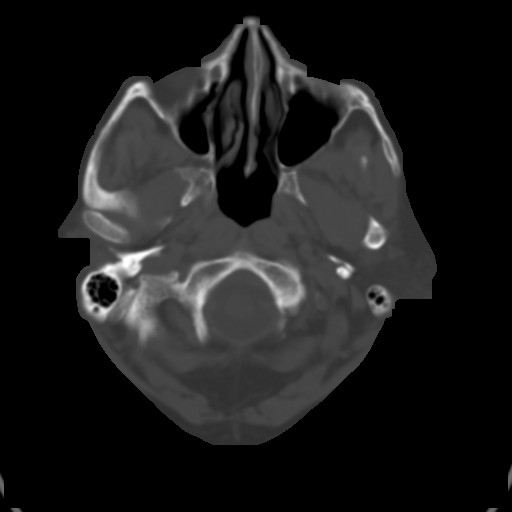
[im 4/31  brain]
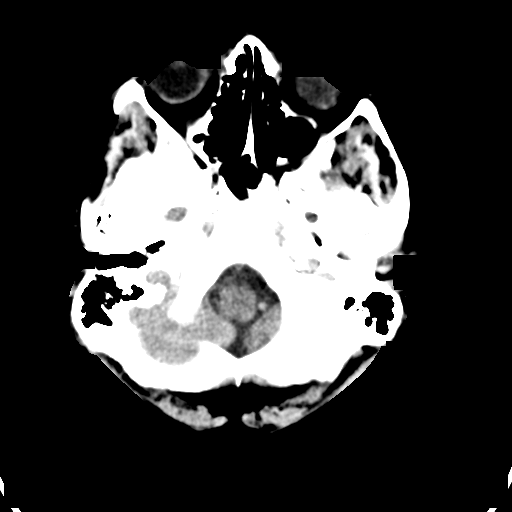
[im 6/31  brain]
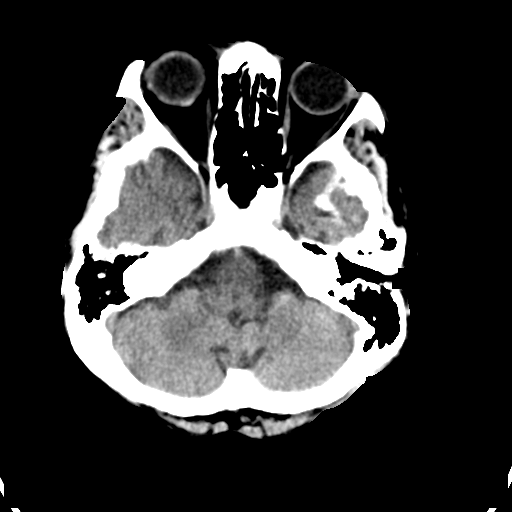
[im 8/31  brain]
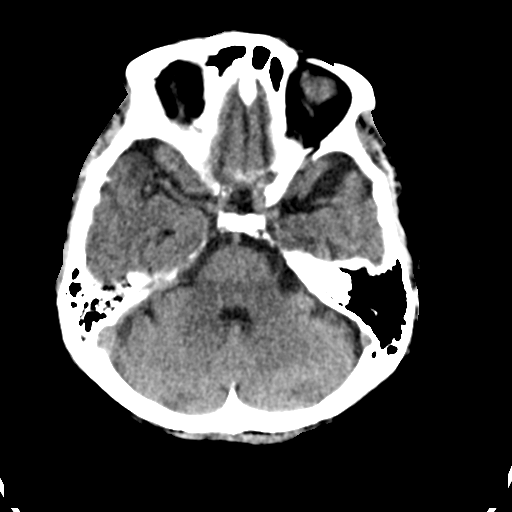
[im 9/31  brain]
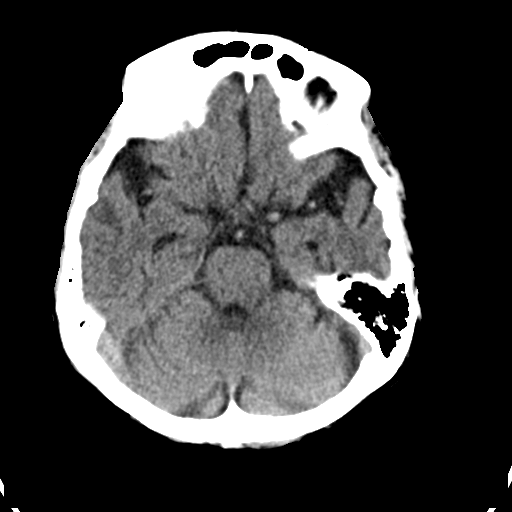
[im 9/31  bone]
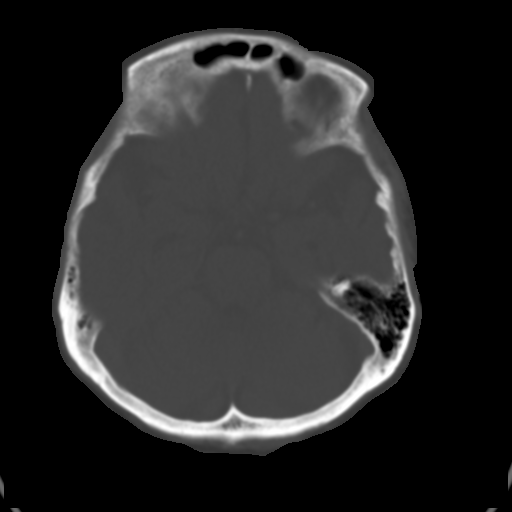
[im 11/31  brain]
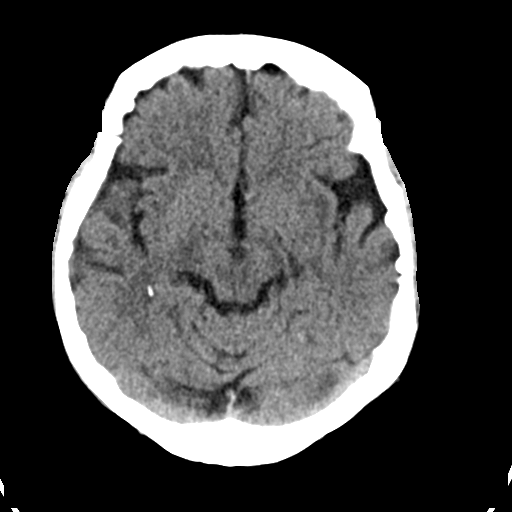
[im 13/31  brain]
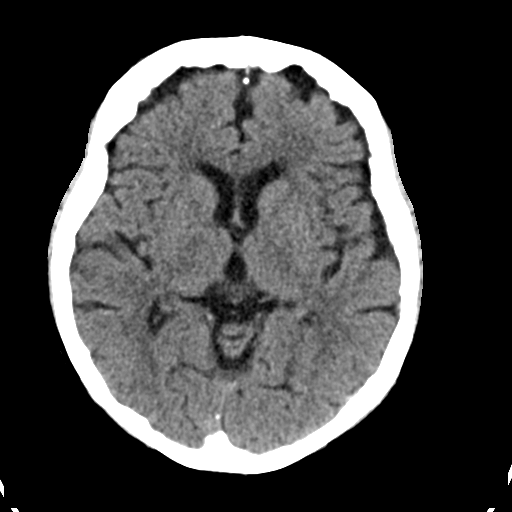
[im 15/31  brain]
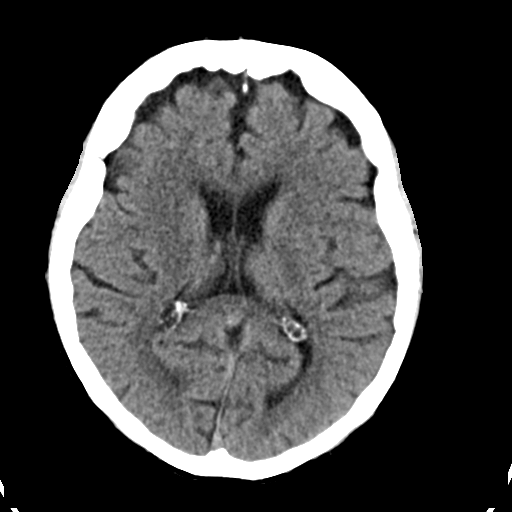
[im 16/31  brain]
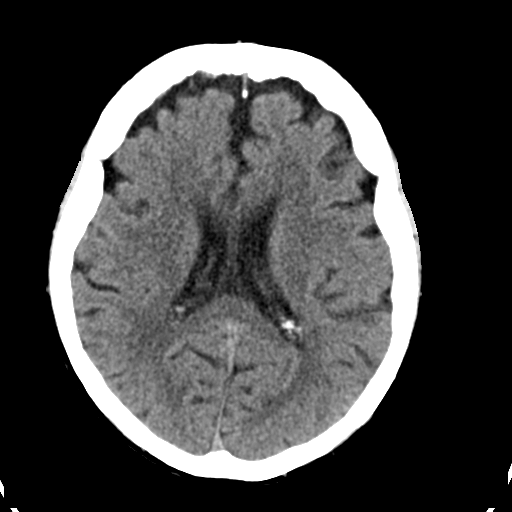
[im 16/31  bone]
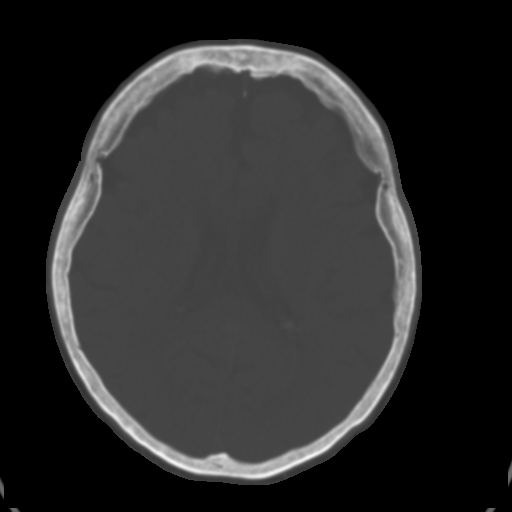
[im 18/31  brain]
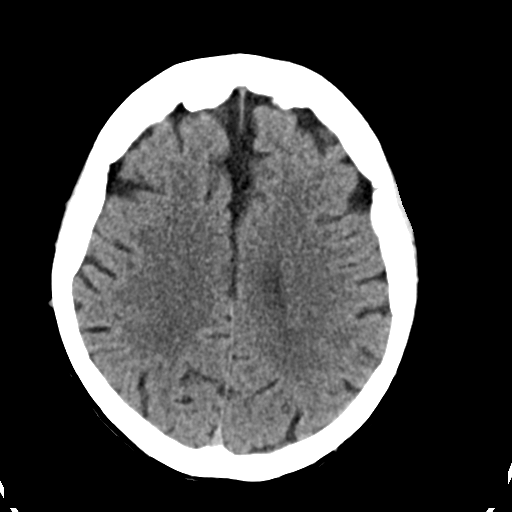
[im 20/31  brain]
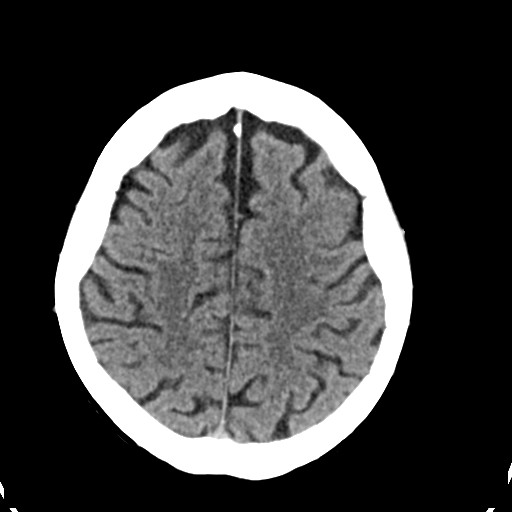
[im 22/31  brain]
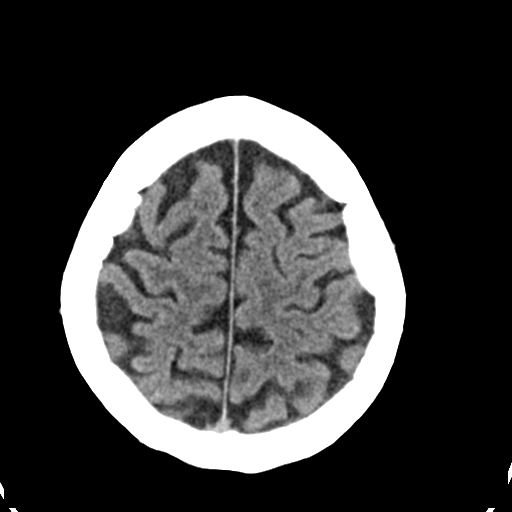
[im 23/31  brain]
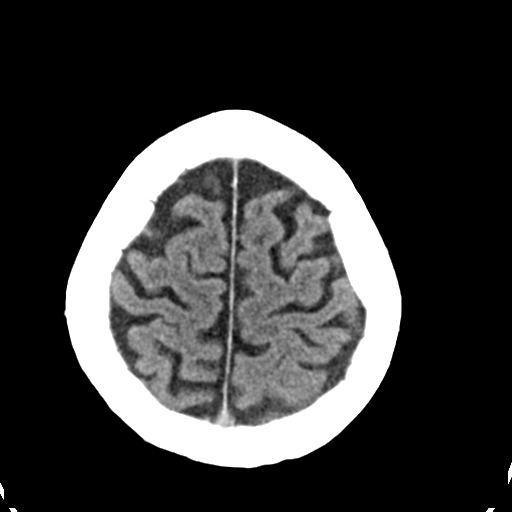
[im 23/31  bone]
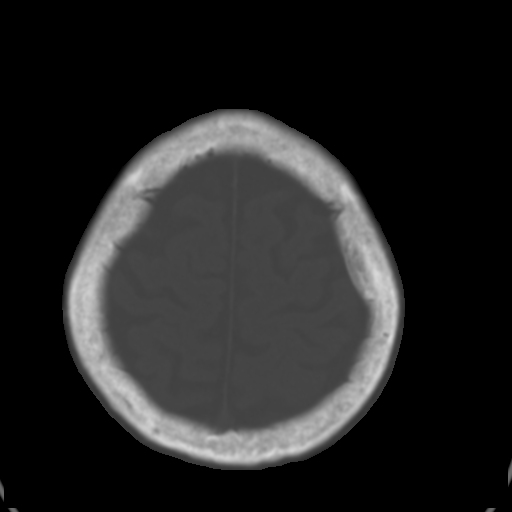
[im 25/31  brain]
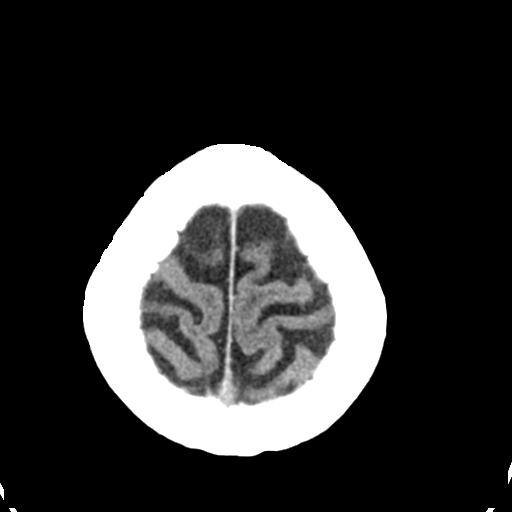
[im 27/31  brain]
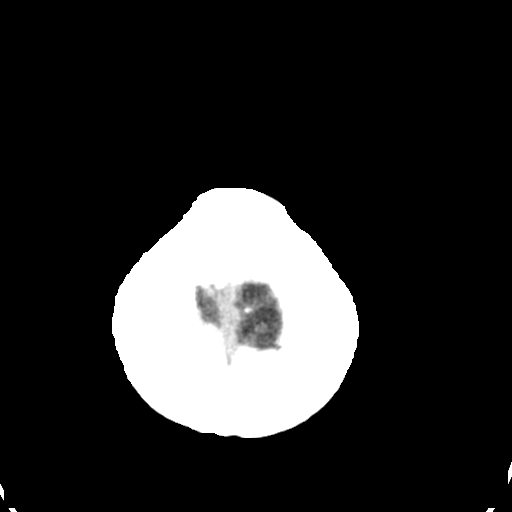
[im 29/31  brain]
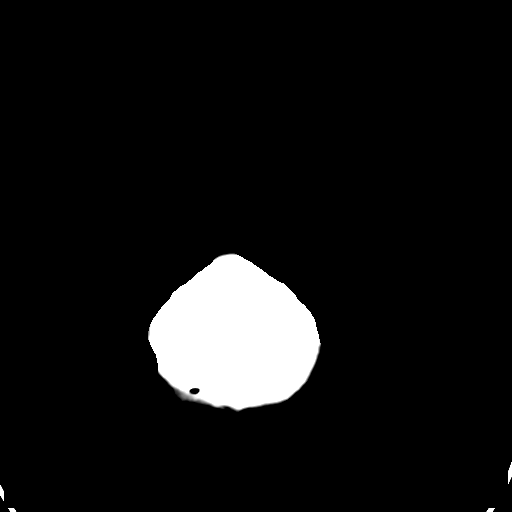

[16 of 30 positions shown; findings below may reference images not displayed]

FINDINGS: No evidence of parenchymal hemorrhage or extra-axial fluid
collection. No mass lesion, mass effect, or midline shift.

No CT evidence of acute infarction.

Subcortical white matter and periventricular small vessel ischemic
changes.

Global cortical atrophy with prominence of the frontal extra-axial
spaces. No ventriculomegaly.

The visualized paranasal sinuses are essentially clear. The mastoid
air cells are unopacified.

Soft tissue laceration overlying the right vertex (series 3/image
72).

No evidence of calvarial fracture.
IMPRESSION: Soft tissue laceration overlying the right vertex. No evidence of
calvarial fracture.

No evidence of acute intracranial abnormality.

## 2016-02-24 DIAGNOSIS — M7062 Trochanteric bursitis, left hip: Secondary | ICD-10-CM | POA: Diagnosis not present

## 2016-03-14 ENCOUNTER — Other Ambulatory Visit: Payer: Self-pay | Admitting: Cardiology

## 2016-05-04 DIAGNOSIS — Z79899 Other long term (current) drug therapy: Secondary | ICD-10-CM | POA: Diagnosis not present

## 2016-05-04 DIAGNOSIS — L409 Psoriasis, unspecified: Secondary | ICD-10-CM | POA: Diagnosis not present

## 2016-05-12 DIAGNOSIS — L409 Psoriasis, unspecified: Secondary | ICD-10-CM | POA: Diagnosis not present

## 2016-06-14 DIAGNOSIS — I10 Essential (primary) hypertension: Secondary | ICD-10-CM | POA: Diagnosis not present

## 2016-06-14 DIAGNOSIS — M5380 Other specified dorsopathies, site unspecified: Secondary | ICD-10-CM | POA: Diagnosis not present

## 2016-06-14 DIAGNOSIS — Z6827 Body mass index (BMI) 27.0-27.9, adult: Secondary | ICD-10-CM | POA: Diagnosis not present

## 2016-06-14 DIAGNOSIS — I25810 Atherosclerosis of coronary artery bypass graft(s) without angina pectoris: Secondary | ICD-10-CM | POA: Diagnosis not present

## 2016-06-14 DIAGNOSIS — L409 Psoriasis, unspecified: Secondary | ICD-10-CM | POA: Diagnosis not present

## 2016-06-14 DIAGNOSIS — E038 Other specified hypothyroidism: Secondary | ICD-10-CM | POA: Diagnosis not present

## 2016-06-14 DIAGNOSIS — R7301 Impaired fasting glucose: Secondary | ICD-10-CM | POA: Diagnosis not present

## 2016-06-14 DIAGNOSIS — R296 Repeated falls: Secondary | ICD-10-CM | POA: Diagnosis not present

## 2016-06-14 DIAGNOSIS — E784 Other hyperlipidemia: Secondary | ICD-10-CM | POA: Diagnosis not present

## 2016-06-24 DIAGNOSIS — Z5181 Encounter for therapeutic drug level monitoring: Secondary | ICD-10-CM | POA: Diagnosis not present

## 2016-06-24 DIAGNOSIS — L409 Psoriasis, unspecified: Secondary | ICD-10-CM | POA: Diagnosis not present

## 2016-06-24 DIAGNOSIS — Z79899 Other long term (current) drug therapy: Secondary | ICD-10-CM | POA: Diagnosis not present

## 2016-06-28 DIAGNOSIS — Z79899 Other long term (current) drug therapy: Secondary | ICD-10-CM | POA: Diagnosis not present

## 2016-06-28 DIAGNOSIS — N3281 Overactive bladder: Secondary | ICD-10-CM | POA: Diagnosis not present

## 2016-06-28 DIAGNOSIS — Z6827 Body mass index (BMI) 27.0-27.9, adult: Secondary | ICD-10-CM | POA: Diagnosis not present

## 2016-06-28 DIAGNOSIS — L409 Psoriasis, unspecified: Secondary | ICD-10-CM | POA: Diagnosis not present

## 2016-08-31 DIAGNOSIS — L409 Psoriasis, unspecified: Secondary | ICD-10-CM | POA: Diagnosis not present

## 2016-08-31 DIAGNOSIS — Z5181 Encounter for therapeutic drug level monitoring: Secondary | ICD-10-CM | POA: Diagnosis not present

## 2016-09-17 DIAGNOSIS — M7061 Trochanteric bursitis, right hip: Secondary | ICD-10-CM | POA: Diagnosis not present

## 2016-09-17 DIAGNOSIS — M7551 Bursitis of right shoulder: Secondary | ICD-10-CM | POA: Diagnosis not present

## 2016-10-12 DIAGNOSIS — H6123 Impacted cerumen, bilateral: Secondary | ICD-10-CM | POA: Diagnosis not present

## 2016-10-12 DIAGNOSIS — R2689 Other abnormalities of gait and mobility: Secondary | ICD-10-CM | POA: Diagnosis not present

## 2016-10-12 DIAGNOSIS — J343 Hypertrophy of nasal turbinates: Secondary | ICD-10-CM | POA: Diagnosis not present

## 2016-10-26 DIAGNOSIS — M7551 Bursitis of right shoulder: Secondary | ICD-10-CM | POA: Diagnosis not present

## 2016-10-26 DIAGNOSIS — M7061 Trochanteric bursitis, right hip: Secondary | ICD-10-CM | POA: Diagnosis not present

## 2016-11-05 DIAGNOSIS — D3131 Benign neoplasm of right choroid: Secondary | ICD-10-CM | POA: Diagnosis not present

## 2016-11-30 DIAGNOSIS — L409 Psoriasis, unspecified: Secondary | ICD-10-CM | POA: Diagnosis not present

## 2016-11-30 DIAGNOSIS — Z23 Encounter for immunization: Secondary | ICD-10-CM | POA: Diagnosis not present

## 2016-11-30 DIAGNOSIS — L570 Actinic keratosis: Secondary | ICD-10-CM | POA: Diagnosis not present

## 2016-11-30 DIAGNOSIS — Z79899 Other long term (current) drug therapy: Secondary | ICD-10-CM | POA: Diagnosis not present

## 2016-12-17 DIAGNOSIS — R358 Other polyuria: Secondary | ICD-10-CM | POA: Diagnosis not present

## 2016-12-17 DIAGNOSIS — R8299 Other abnormal findings in urine: Secondary | ICD-10-CM | POA: Diagnosis not present

## 2016-12-17 DIAGNOSIS — E038 Other specified hypothyroidism: Secondary | ICD-10-CM | POA: Diagnosis not present

## 2016-12-17 DIAGNOSIS — E784 Other hyperlipidemia: Secondary | ICD-10-CM | POA: Diagnosis not present

## 2016-12-17 DIAGNOSIS — I10 Essential (primary) hypertension: Secondary | ICD-10-CM | POA: Diagnosis not present

## 2016-12-17 DIAGNOSIS — R7301 Impaired fasting glucose: Secondary | ICD-10-CM | POA: Diagnosis not present

## 2016-12-21 DIAGNOSIS — F439 Reaction to severe stress, unspecified: Secondary | ICD-10-CM | POA: Diagnosis not present

## 2016-12-21 DIAGNOSIS — L409 Psoriasis, unspecified: Secondary | ICD-10-CM | POA: Diagnosis not present

## 2016-12-21 DIAGNOSIS — Z0000 Encounter for general adult medical examination without abnormal findings: Secondary | ICD-10-CM | POA: Diagnosis not present

## 2016-12-21 DIAGNOSIS — E038 Other specified hypothyroidism: Secondary | ICD-10-CM | POA: Diagnosis not present

## 2016-12-21 DIAGNOSIS — Z1389 Encounter for screening for other disorder: Secondary | ICD-10-CM | POA: Diagnosis not present

## 2016-12-21 DIAGNOSIS — N3281 Overactive bladder: Secondary | ICD-10-CM | POA: Diagnosis not present

## 2016-12-21 DIAGNOSIS — I25810 Atherosclerosis of coronary artery bypass graft(s) without angina pectoris: Secondary | ICD-10-CM | POA: Diagnosis not present

## 2016-12-21 DIAGNOSIS — K5900 Constipation, unspecified: Secondary | ICD-10-CM | POA: Diagnosis not present

## 2016-12-21 DIAGNOSIS — R7301 Impaired fasting glucose: Secondary | ICD-10-CM | POA: Diagnosis not present

## 2016-12-21 DIAGNOSIS — E784 Other hyperlipidemia: Secondary | ICD-10-CM | POA: Diagnosis not present

## 2016-12-21 DIAGNOSIS — I10 Essential (primary) hypertension: Secondary | ICD-10-CM | POA: Diagnosis not present

## 2016-12-21 DIAGNOSIS — Z6828 Body mass index (BMI) 28.0-28.9, adult: Secondary | ICD-10-CM | POA: Diagnosis not present

## 2017-01-19 NOTE — Progress Notes (Signed)
HPI: FU CAD. She had coronary artery bypass and graft in 2008. An echocardiogram in February 2010 showed normal LV function, mild left atrial enlargement, and trivial mitral regurgitation. A Myoview in February 2013 showed no ischemia or infarction and an ejection fraction of 81%. Last carotid Dopplers were performed in December of 2010. There was 0-39% bilateral stenosis. Since I last saw her,the patient denies any dyspnea on exertion, orthopnea, PND, pedal edema, palpitations, syncope or chest pain.   Current Outpatient Prescriptions  Medication Sig Dispense Refill  . acetaminophen (TYLENOL) 325 MG tablet Take 650 mg by mouth every 4 (four) hours as needed.    Marland Kitchen alclomethasone (ACLOVATE) 0.05 % cream Apply topically 2 (two) times daily.    Marland Kitchen aspirin 81 MG tablet Take 81 mg by mouth daily.    Marland Kitchen BIOTIN PO Take 1 tablet by mouth daily.    . cholecalciferol (VITAMIN D) 1000 UNITS tablet Take 1,000 Units by mouth daily.    . Coenzyme Q10 (CO Q 10 PO) Take 1 capsule by mouth daily. Take 3 capsules to equal 300 mg.    . docusate sodium (COLACE) 50 MG capsule Take by mouth daily as needed for mild constipation.     Marland Kitchen escitalopram (LEXAPRO) 5 MG tablet Take 5 mg by mouth daily.    Marland Kitchen levothyroxine (SYNTHROID, LEVOTHROID) 50 MCG tablet Take 50 mcg by mouth See admin instructions. Patient takes 50 mcg on Monday Wednesday Thursday Saturday Sunday.    . levothyroxine (SYNTHROID, LEVOTHROID) 75 MCG tablet Take 75 mcg by mouth See admin instructions. Patient takes only on Tuesday and Friday    . metoprolol succinate (TOPROL-XL) 50 MG 24 hr tablet TAKE 1 TABLET BY MOUTH EVERY DAY 90 tablet 3  . Multiple Vitamins-Minerals (MULTIVITAMIN WITH MINERALS) tablet Take 1 tablet by mouth daily.    . naproxen sodium (ANAPROX) 220 MG tablet Take 220 mg by mouth as needed.    . OTEZLA 30 MG TABS Apply 30 mg topically 2 (two) times daily.  0  . trandolapril (MAVIK) 1 MG tablet Take 1 mg by mouth daily.    .  triamcinolone (KENALOG) 0.025 % cream Apply 1 application topically 2 (two) times daily.     No current facility-administered medications for this visit.      Past Medical History:  Diagnosis Date  . Acute cholecystitis   . Acute renal failure (ARF) (HCC)    history of    denies  . Arthritis   . Cerebrovascular disease   . Chronic diarrhea   . Complication of anesthesia    "doesnt take a lot " medicine  . Coronary artery disease    CABG  x4 LIMA to LAD saphenous vein graft to diagonal, saphenous vein graft to circumflex marginal, saphenous vein graft to distal RAD -- Endoscopic vein harvesting of conduit  . Fuch\'s endothelial dystrophy   . Hyperlipidemia   . Hypertension   . Hypothyroidism   . Pancreatitis     Past Surgical History:  Procedure Laterality Date  . APPENDECTOMY    . BACK SURGERY    . CARDIAC CATHETERIZATION  02/14/2007   Three-vessel coronary artery disease with small, diffusely diseased vessels -- normal LV function with EF of 55%  . CHOLECYSTECTOMY     11   . CHOLECYSTECTOMY, LAPAROSCOPIC  01/28/2010   Acute cholecystitis.  . CORONARY ARTERY BYPASS GRAFT  02/15/2007   x4 LIMA to LAD saphenous vein graft to diagonal, saphenous vein graft to circumflex marginal,  saphenous vein graft to distal RAD -- Endoscopic vein harvesting of conduit  . ERCP W/ SPHINCTEROTOMY AND BALLOON DILATION  12/26/2007  . HEMORRHOID SURGERY  10/03/2007  . KNEE ARTHROSCOPY Right 6/14  . LUMBAR LAMINECTOMY/DECOMPRESSION MICRODISCECTOMY N/A 01/31/2014   Procedure: L3-5 DECOMPRESSION/REMOVAL OF FACET CYST;  Surgeon: Melina Schools, MD;  Location: [facility];  Service: Orthopedics;  Laterality: N/A;    Social History   Social History  . Marital status: Widowed    Spouse name: N/A  . Number of children: N/A  . Years of education: N/A   Occupational History  . Not on file.   Social History Main Topics  . Smoking status: Former Smoker    Packs/day: 0.25    Years: 4.00    Types:  Cigarettes    Quit date: 01/25/1953  . Smokeless tobacco: Not on file  . Alcohol use No  . Drug use: No  . Sexual activity: Not on file   Other Topics Concern  . Not on file   Social History Narrative  . No narrative on file    Family History  Problem Relation Age of Onset  . Heart attack Mother 20  . Heart attack Father 61    heavy smoker    ROS: no fevers or chills, productive cough, hemoptysis, dysphasia, odynophagia, melena, hematochezia, dysuria, hematuria, rash, seizure activity, orthopnea, PND, pedal edema, claudication. Remaining systems are negative.  Physical Exam: Well-developed well-nourished in no acute distress.  Skin is warm and dry.  HEENT is normal.  Neck is supple. No bruits Chest is clear to auscultation with normal expansion.  Cardiovascular exam is regular rate and rhythm.  Abdominal exam nontender or distended. No masses palpated. Extremities show no edema. neuro grossly intact  ECG-sinus rhythm at a rate of 70. Right bundle branch block.  A/P  1 coronary artery disease-continue aspirin. She declines statins.   2 hypertension-blood pressure is controlled. Continue present medications.  3 hyperlipidemia-continue diet. Patient declines statins.   4 carotid artery disease-continue aspirin.    Kirk Ruths, MD

## 2017-01-25 ENCOUNTER — Encounter: Payer: Self-pay | Admitting: Cardiology

## 2017-01-25 ENCOUNTER — Ambulatory Visit (INDEPENDENT_AMBULATORY_CARE_PROVIDER_SITE_OTHER): Payer: Medicare Other | Admitting: Cardiology

## 2017-01-25 VITALS — BP 120/60 | HR 70 | Ht 62.0 in | Wt 155.0 lb

## 2017-01-25 DIAGNOSIS — I252 Old myocardial infarction: Secondary | ICD-10-CM

## 2017-01-25 DIAGNOSIS — I2510 Atherosclerotic heart disease of native coronary artery without angina pectoris: Secondary | ICD-10-CM | POA: Diagnosis not present

## 2017-01-25 DIAGNOSIS — I10 Essential (primary) hypertension: Secondary | ICD-10-CM

## 2017-01-25 DIAGNOSIS — E7800 Pure hypercholesterolemia, unspecified: Secondary | ICD-10-CM | POA: Diagnosis not present

## 2017-01-25 NOTE — Patient Instructions (Signed)
Your physician wants you to follow-up in: ONE YEAR WITH DR CRENSHAW You will receive a reminder letter in the mail two months in advance. If you don't receive a letter, please call our office to schedule the follow-up appointment.   If you need a refill on your cardiac medications before your next appointment, please call your pharmacy.  

## 2017-02-07 DIAGNOSIS — M7501 Adhesive capsulitis of right shoulder: Secondary | ICD-10-CM | POA: Diagnosis not present

## 2017-02-07 DIAGNOSIS — M7551 Bursitis of right shoulder: Secondary | ICD-10-CM | POA: Diagnosis not present

## 2017-02-14 DIAGNOSIS — M7551 Bursitis of right shoulder: Secondary | ICD-10-CM | POA: Diagnosis not present

## 2017-02-16 DIAGNOSIS — M7551 Bursitis of right shoulder: Secondary | ICD-10-CM | POA: Diagnosis not present

## 2017-02-23 DIAGNOSIS — M7501 Adhesive capsulitis of right shoulder: Secondary | ICD-10-CM | POA: Diagnosis not present

## 2017-02-23 DIAGNOSIS — M7551 Bursitis of right shoulder: Secondary | ICD-10-CM | POA: Diagnosis not present

## 2017-02-28 DIAGNOSIS — M7551 Bursitis of right shoulder: Secondary | ICD-10-CM | POA: Diagnosis not present

## 2017-02-28 DIAGNOSIS — M7501 Adhesive capsulitis of right shoulder: Secondary | ICD-10-CM | POA: Diagnosis not present

## 2017-03-18 ENCOUNTER — Other Ambulatory Visit: Payer: Self-pay | Admitting: Cardiology

## 2017-03-18 NOTE — Telephone Encounter (Signed)
Rx request sent to pharmacy.  

## 2017-03-29 DIAGNOSIS — M75122 Complete rotator cuff tear or rupture of left shoulder, not specified as traumatic: Secondary | ICD-10-CM | POA: Diagnosis not present

## 2017-03-29 DIAGNOSIS — M7501 Adhesive capsulitis of right shoulder: Secondary | ICD-10-CM | POA: Diagnosis not present

## 2017-03-29 DIAGNOSIS — L409 Psoriasis, unspecified: Secondary | ICD-10-CM | POA: Diagnosis not present

## 2017-03-29 DIAGNOSIS — L570 Actinic keratosis: Secondary | ICD-10-CM | POA: Diagnosis not present

## 2017-03-29 DIAGNOSIS — M25512 Pain in left shoulder: Secondary | ICD-10-CM | POA: Diagnosis not present

## 2017-03-29 DIAGNOSIS — Z79899 Other long term (current) drug therapy: Secondary | ICD-10-CM | POA: Diagnosis not present

## 2017-04-05 DIAGNOSIS — M7541 Impingement syndrome of right shoulder: Secondary | ICD-10-CM | POA: Diagnosis not present

## 2017-04-05 DIAGNOSIS — M75122 Complete rotator cuff tear or rupture of left shoulder, not specified as traumatic: Secondary | ICD-10-CM | POA: Diagnosis not present

## 2017-04-22 DIAGNOSIS — Z6827 Body mass index (BMI) 27.0-27.9, adult: Secondary | ICD-10-CM | POA: Diagnosis not present

## 2017-04-22 DIAGNOSIS — R252 Cramp and spasm: Secondary | ICD-10-CM | POA: Diagnosis not present

## 2017-04-22 DIAGNOSIS — L409 Psoriasis, unspecified: Secondary | ICD-10-CM | POA: Diagnosis not present

## 2017-04-22 DIAGNOSIS — I10 Essential (primary) hypertension: Secondary | ICD-10-CM | POA: Diagnosis not present

## 2017-04-22 DIAGNOSIS — M25519 Pain in unspecified shoulder: Secondary | ICD-10-CM | POA: Diagnosis not present

## 2017-04-22 DIAGNOSIS — N3281 Overactive bladder: Secondary | ICD-10-CM | POA: Diagnosis not present

## 2017-04-22 DIAGNOSIS — I25810 Atherosclerosis of coronary artery bypass graft(s) without angina pectoris: Secondary | ICD-10-CM | POA: Diagnosis not present

## 2017-04-22 DIAGNOSIS — F439 Reaction to severe stress, unspecified: Secondary | ICD-10-CM | POA: Diagnosis not present

## 2017-04-22 DIAGNOSIS — R7301 Impaired fasting glucose: Secondary | ICD-10-CM | POA: Diagnosis not present

## 2017-04-26 DIAGNOSIS — L409 Psoriasis, unspecified: Secondary | ICD-10-CM | POA: Diagnosis not present

## 2017-05-05 DIAGNOSIS — S7002XA Contusion of left hip, initial encounter: Secondary | ICD-10-CM | POA: Diagnosis not present

## 2017-06-08 ENCOUNTER — Emergency Department
Admission: EM | Admit: 2017-06-08 | Discharge: 2017-06-08 | Disposition: A | Discharge status: Home / Self Care | Payer: Medicare Other | Attending: Emergency Medicine | Admitting: Emergency Medicine

## 2017-06-08 ENCOUNTER — Encounter: Payer: Self-pay | Admitting: Emergency Medicine

## 2017-06-08 DIAGNOSIS — N3000 Acute cystitis without hematuria: Secondary | ICD-10-CM | POA: Diagnosis not present

## 2017-06-08 DIAGNOSIS — R197 Diarrhea, unspecified: Secondary | ICD-10-CM | POA: Diagnosis not present

## 2017-06-08 DIAGNOSIS — I10 Essential (primary) hypertension: Secondary | ICD-10-CM | POA: Insufficient documentation

## 2017-06-08 DIAGNOSIS — Z951 Presence of aortocoronary bypass graft: Secondary | ICD-10-CM | POA: Diagnosis not present

## 2017-06-08 DIAGNOSIS — Z87891 Personal history of nicotine dependence: Secondary | ICD-10-CM | POA: Diagnosis not present

## 2017-06-08 DIAGNOSIS — E039 Hypothyroidism, unspecified: Secondary | ICD-10-CM | POA: Insufficient documentation

## 2017-06-08 DIAGNOSIS — I2510 Atherosclerotic heart disease of native coronary artery without angina pectoris: Secondary | ICD-10-CM | POA: Insufficient documentation

## 2017-06-08 LAB — CBC WITH DIFFERENTIAL/PLATELET
BASOS PCT: 1 %
Basophils Absolute: 0.1 10*3/uL (ref 0–0.1)
EOS ABS: 0.1 10*3/uL (ref 0–0.7)
EOS PCT: 1 %
HCT: 42.3 % (ref 35.0–47.0)
HEMOGLOBIN: 14.5 g/dL (ref 12.0–16.0)
Lymphocytes Relative: 24 %
Lymphs Abs: 2.1 10*3/uL (ref 1.0–3.6)
MCH: 31.1 pg (ref 26.0–34.0)
MCHC: 34.3 g/dL (ref 32.0–36.0)
MCV: 90.7 fL (ref 80.0–100.0)
Monocytes Absolute: 2 10*3/uL — ABNORMAL HIGH (ref 0.2–0.9)
Monocytes Relative: 22 %
NEUTROS PCT: 52 %
Neutro Abs: 4.7 10*3/uL (ref 1.4–6.5)
PLATELETS: 222 10*3/uL (ref 150–440)
RBC: 4.66 MIL/uL (ref 3.80–5.20)
RDW: 13.1 % (ref 11.5–14.5)
WBC: 9 10*3/uL (ref 3.6–11.0)

## 2017-06-08 LAB — COMPREHENSIVE METABOLIC PANEL
ALBUMIN: 3.6 g/dL (ref 3.5–5.0)
ALK PHOS: 31 U/L — AB (ref 38–126)
ALT: 29 U/L (ref 14–54)
ANION GAP: 9 (ref 5–15)
AST: 38 U/L (ref 15–41)
BUN: 15 mg/dL (ref 6–20)
CALCIUM: 8.3 mg/dL — AB (ref 8.9–10.3)
CHLORIDE: 102 mmol/L (ref 101–111)
CO2: 21 mmol/L — AB (ref 22–32)
Creatinine, Ser: 1.02 mg/dL — ABNORMAL HIGH (ref 0.44–1.00)
GFR calc Af Amer: 56 mL/min — ABNORMAL LOW (ref >60)
GFR calc non Af Amer: 48 mL/min — ABNORMAL LOW (ref >60)
GLUCOSE: 120 mg/dL — AB (ref 65–99)
Potassium: 3.5 mmol/L (ref 3.5–5.1)
SODIUM: 132 mmol/L — AB (ref 135–145)
Total Bilirubin: 0.8 mg/dL (ref 0.3–1.2)
Total Protein: 7.5 g/dL (ref 6.5–8.1)

## 2017-06-08 LAB — URINALYSIS, COMPLETE (UACMP) WITH MICROSCOPIC
BILIRUBIN URINE: NEGATIVE
Glucose, UA: NEGATIVE mg/dL
KETONES UR: NEGATIVE mg/dL
NITRITE: POSITIVE — AB
PROTEIN: NEGATIVE mg/dL
pH: 5.5 (ref 5.0–8.0)

## 2017-06-08 LAB — GASTROINTESTINAL PANEL BY PCR, STOOL (REPLACES STOOL CULTURE)
ADENOVIRUS F40/41: NOT DETECTED
ASTROVIRUS: NOT DETECTED
CAMPYLOBACTER SPECIES: NOT DETECTED
CYCLOSPORA CAYETANENSIS: DETECTED — AB
Cryptosporidium: NOT DETECTED
ENTEROPATHOGENIC E COLI (EPEC): NOT DETECTED
ENTEROTOXIGENIC E COLI (ETEC): NOT DETECTED
Entamoeba histolytica: NOT DETECTED
Enteroaggregative E coli (EAEC): NOT DETECTED
Giardia lamblia: NOT DETECTED
NOROVIRUS GI/GII: NOT DETECTED
PLESIMONAS SHIGELLOIDES: NOT DETECTED
ROTAVIRUS A: NOT DETECTED
SAPOVIRUS (I, II, IV, AND V): NOT DETECTED
SHIGA LIKE TOXIN PRODUCING E COLI (STEC): NOT DETECTED
Salmonella species: NOT DETECTED
Shigella/Enteroinvasive E coli (EIEC): NOT DETECTED
VIBRIO SPECIES: NOT DETECTED
Vibrio cholerae: NOT DETECTED
Yersinia enterocolitica: NOT DETECTED

## 2017-06-08 LAB — C DIFFICILE QUICK SCREEN W PCR REFLEX
C DIFFICILE (CDIFF) INTERP: NOT DETECTED
C DIFFICLE (CDIFF) ANTIGEN: NEGATIVE
C Diff toxin: NEGATIVE

## 2017-06-08 LAB — LIPASE, BLOOD: Lipase: 21 U/L (ref 11–51)

## 2017-06-08 MED ORDER — PROBIOTIC ADVANCED PO CAPS: 1.0000 | ORAL_CAPSULE | Freq: Two times a day (BID) | ORAL | 1 refills | Status: DC

## 2017-06-08 MED ORDER — CEFTRIAXONE SODIUM 1 G IJ SOLR
1.0000 g | Freq: Once | INTRAMUSCULAR | Status: AC
  Administered 2017-06-08: 1 g via INTRAVENOUS
  Filled 2017-06-08: qty 10

## 2017-06-08 MED ORDER — CEPHALEXIN 250 MG PO CAPS: 250.0000 mg | ORAL_CAPSULE | Freq: Four times a day (QID) | ORAL | 0 refills | Status: AC

## 2017-06-08 MED ORDER — SODIUM CHLORIDE 0.9 % IV SOLN
1000.0000 mL | Freq: Once | INTRAVENOUS | Status: AC
  Administered 2017-06-08: 1000 mL via INTRAVENOUS

## 2017-06-08 MED ORDER — LOPERAMIDE HCL 2 MG PO TABS: 2.0000 mg | ORAL_TABLET | Freq: Four times a day (QID) | ORAL | 0 refills | Status: DC | PRN

## 2017-06-08 NOTE — ED Provider Notes (Signed)
[facility] Emergency Department Provider Note       Time seen: ----------------------------------------- 8:28 AM on 06/08/2017 -----------------------------------------     I have reviewed the triage vital signs and the nursing notes.   HISTORY   Chief Complaint Diarrhea    HPI [name] is a 83 y.o. female who presents to the ED for diarrhea. Patient was brought by EMS report and legs were diarrhea that has been nearly continuous for the past 2-3 days. She's had some mild abdominal cramping but denies any pain, denies nausea, vomiting, fevers, chills or other complaints. This has not been a problem for her before, nothing makes it better or worse.   Past Medical History:  Diagnosis Date  . Acute cholecystitis   . Acute renal failure (ARF) (HCC)    history of    denies  . Arthritis   . Cerebrovascular disease   . Chronic diarrhea   . Complication of anesthesia    "doesnt take a lot " medicine  . Coronary artery disease    CABG  x4 LIMA to LAD saphenous vein graft to diagonal, saphenous vein graft to circumflex marginal, saphenous vein graft to distal RAD -- Endoscopic vein harvesting of conduit  . Fuch's endothelial dystrophy   . Hyperlipidemia   . Hypertension   . Hypothyroidism   . Pancreatitis     Patient Active Problem List   Diagnosis Date Noted  . S/P lumbar laminectomy 02/14/2014  . PNA (pneumonia) 02/14/2014  . Psoriasis 02/07/2014  . CAD (coronary artery disease) 02/07/2014  . Spinal stenosis 01/31/2014  . Preop cardiovascular exam 12/22/2012  . HYPOTHYROIDISM 03/11/2009  . HYPERLIPIDEMIA 03/11/2009  . Essential hypertension 03/11/2009  . CORONARY ARTERY DISEASE 03/11/2009  . Cerebrovascular disease 03/11/2009  . CHEST PAIN 03/11/2009    Past Surgical History:  Procedure Laterality Date  . APPENDECTOMY    . BACK SURGERY    . CARDIAC CATHETERIZATION  02/14/2007   Three-vessel coronary artery disease with small,  diffusely diseased vessels -- normal LV function with EF of 55%  . CHOLECYSTECTOMY     11  . CHOLECYSTECTOMY, LAPAROSCOPIC  01/28/2010   Acute cholecystitis.  . CORONARY ARTERY BYPASS GRAFT  02/15/2007   x4 LIMA to LAD saphenous vein graft to diagonal, saphenous vein graft to circumflex marginal, saphenous vein graft to distal RAD -- Endoscopic vein harvesting of conduit  . ERCP W/ SPHINCTEROTOMY AND BALLOON DILATION  12/26/2007  . HEMORRHOID SURGERY  10/03/2007  . KNEE ARTHROSCOPY Right 6/14  . LUMBAR LAMINECTOMY/DECOMPRESSION MICRODISCECTOMY N/A 01/31/2014   Procedure: L3-5 DECOMPRESSION/REMOVAL OF FACET CYST;  Surgeon: Melina Schools, MD;  Location: [place];  Service: Orthopedics;  Laterality: N/A;    Allergies Phenytoin sodium extended; Epinephrine; Nitroglycerin; Valium; and Hibiclens [chlorhexidine gluconate]  Social History Social History  Substance Use Topics  . Smoking status: Former Smoker    Packs/day: 0.25    Years: 4.00    Types: Cigarettes    Quit date: 01/25/1953  . Smokeless tobacco: Never Used  . Alcohol use No    Review of Systems Constitutional: Negative for fever. Cardiovascular: Negative for chest pain. Respiratory: Negative for shortness of breath. Gastrointestinal: Positive for abdominal cramping, diarrhea Genitourinary: Negative for dysuria. Musculoskeletal: Negative for back pain. Skin: Negative for rash. Neurological: Negative for headaches, focal weakness or numbness.  All systems negative/normal/unremarkable except as stated in the HPI  ____________________________________________   PHYSICAL EXAM:  VITAL SIGNS: ED Triage Vitals  Enc Vitals Group  BP      Pulse      Resp      Temp      Temp src      SpO2      Weight      Height      Head Circumference      Peak Flow      Pain Score      Pain Loc      Pain Edu?      Excl. in [place]?     Constitutional: Alert and oriented. Well appearing and in no distress. Eyes: Conjunctivae are  normal. Normal extraocular movements. ENT   Head: Normocephalic and atraumatic.   Nose: No congestion/rhinnorhea.   Mouth/Throat: Mucous membranes are moist.   Neck: No stridor. Cardiovascular: Normal rate, regular rhythm. No murmurs, rubs, or gallops. Respiratory: Normal respiratory effort without tachypnea nor retractions. Breath sounds are clear and equal bilaterally. No wheezes/rales/rhonchi. Gastrointestinal: Soft and nontender. Normal bowel sounds Musculoskeletal: Nontender with normal range of motion in extremities. No lower extremity tenderness nor edema. Neurologic:  Normal speech and language. No gross focal neurologic deficits are appreciated.  Skin:  Skin is warm, dry and intact. No rash noted. Psychiatric: Mood and affect are normal. Speech and behavior are normal.  ____________________________________________  ED COURSE:  Pertinent labs & imaging results that were available during my care of the patient were reviewed by me and considered in my medical decision making (see chart for details). Patient presents for diarrhea and possible dehydration, we will assess with labs and imaging as indicated.   Procedures ____________________________________________   LABS (pertinent positives/negatives)  Labs Reviewed  CBC WITH DIFFERENTIAL/PLATELET - Abnormal; Notable for the following:       Result Value   Monocytes Absolute 2.0 (*)    All other components within normal limits  COMPREHENSIVE METABOLIC PANEL - Abnormal; Notable for the following:    Sodium 132 (*)    CO2 21 (*)    Glucose, Bld 120 (*)    Creatinine, Ser 1.02 (*)    Calcium 8.3 (*)    Alkaline Phosphatase 31 (*)    GFR calc non Af Amer 48 (*)    GFR calc Af Amer 56 (*)    All other components within normal limits  URINALYSIS, COMPLETE (UACMP) WITH MICROSCOPIC - Abnormal; Notable for the following:    Specific Gravity, Urine <1.005 (*)    Hgb urine dipstick SMALL (*)    Nitrite POSITIVE (*)     Leukocytes, UA LARGE (*)    Squamous Epithelial / LPF 0-5 (*)    Bacteria, UA MANY (*)    All other components within normal limits  GASTROINTESTINAL PANEL BY PCR, STOOL (REPLACES STOOL CULTURE)  C DIFFICILE QUICK SCREEN W PCR REFLEX  LIPASE, BLOOD   ___________________________________________  FINAL ASSESSMENT AND PLAN  Diarrhea, cystitis  Plan: Patient's labs were dictated above. Patient had presented for diarrhea for the last 2-3 days with abdominal cramping. She was unable to provide a stool specimen for [place]. She was also found to have a UTI here. She was given a liter of fluids and we will give her antibiotics for UTI. I did encourage probiotics and antidiarrheal agents. She is stable for outpatient follow-up.   Earleen Newport, MD   Note: This note was generated in part or whole with voice recognition software. Voice recognition is usually quite accurate but there are transcription errors that can and very often do occur. I apologize for any typographical  errors that were not detected and corrected.     Earleen Newport, MD 06/08/17 1125

## 2017-06-08 NOTE — ED Triage Notes (Signed)
Pt in via EMS from [facility], pt reports diarrhea x 2-3 days w/ abdominal cramping, denies any N/V, denies any other complaints.  Pt A/Ox4, vitals WDL, NAD noted at this time.

## 2017-06-08 NOTE — ED Notes (Signed)
Pt ambulatory to toilet with standby assist, able to provide urine specimen, states she still does not have to have BM at this time.

## 2017-06-08 NOTE — ED Notes (Signed)
Pt ambulatory to toilet with standby assist, pt urinated but then threw toilet paper into specimen.  Pt unable to have BM at this time.  Advised her to let RN know if she feels the urge to go.

## 2017-06-09 ENCOUNTER — Telehealth: Payer: Self-pay | Admitting: Emergency Medicine

## 2017-06-09 NOTE — Telephone Encounter (Addendum)
Called to inform of stool pcr report.  Per dr Corky Downs will stop keflex and start septra ds 1 tablet twice daily for 7 days.  Left message.  Called new rx to [facility] and informed them she is to stop the cephalexin( she already had that filled.)  6/22--called pt again and no answer.  Called contact and left message on his phone to call me.

## 2017-06-09 NOTE — ED Provider Notes (Signed)
Stool culture positive for cyclosporiasis, bactrim is primary indicated abx   Lavonia Drafts, MD 06/09/17 1100

## 2017-06-10 LAB — URINE CULTURE: Special Requests: NORMAL

## 2017-06-12 ENCOUNTER — Telehealth: Payer: Self-pay | Admitting: Emergency Medicine

## 2017-06-12 NOTE — Telephone Encounter (Signed)
multiple calls made back and forth from son to this RN regarding his mother's care after recieving a message on the pt's home machine about needing a differenct antibiotic left by Genia Hotter, pt with a positive stool culture, chart reviewed by dr Kerman Passey. Bactrim ordered and faxed to [facility] where pt is receiving rehab care. Keflex d/c'd, faxed script to [facility] after speaking with Caryl Pina to [phone]

## 2017-06-17 DIAGNOSIS — R197 Diarrhea, unspecified: Secondary | ICD-10-CM | POA: Diagnosis not present

## 2017-06-17 DIAGNOSIS — Z6827 Body mass index (BMI) 27.0-27.9, adult: Secondary | ICD-10-CM | POA: Diagnosis not present

## 2017-06-17 DIAGNOSIS — N390 Urinary tract infection, site not specified: Secondary | ICD-10-CM | POA: Diagnosis not present

## 2017-06-27 DIAGNOSIS — N398 Other specified disorders of urinary system: Secondary | ICD-10-CM | POA: Diagnosis not present

## 2017-06-27 DIAGNOSIS — R197 Diarrhea, unspecified: Secondary | ICD-10-CM | POA: Diagnosis not present

## 2017-06-27 DIAGNOSIS — Z6827 Body mass index (BMI) 27.0-27.9, adult: Secondary | ICD-10-CM | POA: Diagnosis not present

## 2017-06-27 DIAGNOSIS — R7301 Impaired fasting glucose: Secondary | ICD-10-CM | POA: Diagnosis not present

## 2017-06-27 DIAGNOSIS — I10 Essential (primary) hypertension: Secondary | ICD-10-CM | POA: Diagnosis not present

## 2017-06-27 DIAGNOSIS — F439 Reaction to severe stress, unspecified: Secondary | ICD-10-CM | POA: Diagnosis not present

## 2017-07-13 DIAGNOSIS — L570 Actinic keratosis: Secondary | ICD-10-CM | POA: Diagnosis not present

## 2017-07-13 DIAGNOSIS — D485 Neoplasm of uncertain behavior of skin: Secondary | ICD-10-CM | POA: Diagnosis not present

## 2017-07-13 DIAGNOSIS — L814 Other melanin hyperpigmentation: Secondary | ICD-10-CM | POA: Diagnosis not present

## 2017-07-13 DIAGNOSIS — L409 Psoriasis, unspecified: Secondary | ICD-10-CM | POA: Diagnosis not present

## 2017-07-28 DIAGNOSIS — M75122 Complete rotator cuff tear or rupture of left shoulder, not specified as traumatic: Secondary | ICD-10-CM | POA: Diagnosis not present

## 2017-07-28 DIAGNOSIS — M7501 Adhesive capsulitis of right shoulder: Secondary | ICD-10-CM | POA: Diagnosis not present

## 2017-08-10 DIAGNOSIS — E782 Mixed hyperlipidemia: Secondary | ICD-10-CM | POA: Diagnosis not present

## 2017-08-10 DIAGNOSIS — M810 Age-related osteoporosis without current pathological fracture: Secondary | ICD-10-CM | POA: Diagnosis not present

## 2017-08-10 DIAGNOSIS — I25810 Atherosclerosis of coronary artery bypass graft(s) without angina pectoris: Secondary | ICD-10-CM | POA: Diagnosis not present

## 2017-08-10 DIAGNOSIS — E039 Hypothyroidism, unspecified: Secondary | ICD-10-CM | POA: Diagnosis not present

## 2017-09-16 DIAGNOSIS — Z23 Encounter for immunization: Secondary | ICD-10-CM | POA: Diagnosis not present

## 2017-09-27 DIAGNOSIS — C44311 Basal cell carcinoma of skin of nose: Secondary | ICD-10-CM | POA: Diagnosis not present

## 2017-09-27 DIAGNOSIS — Z23 Encounter for immunization: Secondary | ICD-10-CM | POA: Diagnosis not present

## 2017-09-27 DIAGNOSIS — Z79899 Other long term (current) drug therapy: Secondary | ICD-10-CM | POA: Diagnosis not present

## 2017-09-27 DIAGNOSIS — D485 Neoplasm of uncertain behavior of skin: Secondary | ICD-10-CM | POA: Diagnosis not present

## 2017-09-27 DIAGNOSIS — L821 Other seborrheic keratosis: Secondary | ICD-10-CM | POA: Diagnosis not present

## 2017-09-27 DIAGNOSIS — L409 Psoriasis, unspecified: Secondary | ICD-10-CM | POA: Diagnosis not present

## 2017-11-16 DIAGNOSIS — M179 Osteoarthritis of knee, unspecified: Secondary | ICD-10-CM | POA: Diagnosis not present

## 2017-11-16 DIAGNOSIS — M25562 Pain in left knee: Secondary | ICD-10-CM | POA: Diagnosis not present

## 2017-11-23 DIAGNOSIS — I25810 Atherosclerosis of coronary artery bypass graft(s) without angina pectoris: Secondary | ICD-10-CM | POA: Diagnosis not present

## 2017-11-23 DIAGNOSIS — M76899 Other specified enthesopathies of unspecified lower limb, excluding foot: Secondary | ICD-10-CM | POA: Diagnosis not present

## 2017-11-23 DIAGNOSIS — Z79899 Other long term (current) drug therapy: Secondary | ICD-10-CM | POA: Diagnosis not present

## 2017-12-16 DIAGNOSIS — C44311 Basal cell carcinoma of skin of nose: Secondary | ICD-10-CM | POA: Diagnosis not present

## 2018-01-05 DIAGNOSIS — H353131 Nonexudative age-related macular degeneration, bilateral, early dry stage: Secondary | ICD-10-CM | POA: Diagnosis not present

## 2018-01-05 DIAGNOSIS — D3131 Benign neoplasm of right choroid: Secondary | ICD-10-CM | POA: Diagnosis not present

## 2018-01-12 DIAGNOSIS — L821 Other seborrheic keratosis: Secondary | ICD-10-CM | POA: Diagnosis not present

## 2018-02-10 DIAGNOSIS — Z0000 Encounter for general adult medical examination without abnormal findings: Secondary | ICD-10-CM | POA: Diagnosis not present

## 2018-02-10 DIAGNOSIS — E538 Deficiency of other specified B group vitamins: Secondary | ICD-10-CM | POA: Diagnosis not present

## 2018-02-10 DIAGNOSIS — E039 Hypothyroidism, unspecified: Secondary | ICD-10-CM | POA: Diagnosis not present

## 2018-02-10 DIAGNOSIS — I25810 Atherosclerosis of coronary artery bypass graft(s) without angina pectoris: Secondary | ICD-10-CM | POA: Diagnosis not present

## 2018-02-10 DIAGNOSIS — E782 Mixed hyperlipidemia: Secondary | ICD-10-CM | POA: Diagnosis not present

## 2018-02-10 DIAGNOSIS — Z79899 Other long term (current) drug therapy: Secondary | ICD-10-CM | POA: Diagnosis not present

## 2018-03-03 ENCOUNTER — Encounter: Payer: Self-pay | Admitting: Cardiology

## 2018-03-06 NOTE — Progress Notes (Signed)
HPI: FU CAD. She had coronary artery bypass and graft in 2008. An echocardiogram in February 2010 showed normal LV function, mild left atrial enlargement, and trivial mitral regurgitation. A Myoview in February 2013 showed no ischemia or infarction and an ejection fraction of 81%. Last carotid Dopplers were performed in December of 2010. There was 0-39% bilateral stenosis. Since I last saw her,she denies dyspnea or chest pain.  Some dizziness with standing.  No syncope.  Unsteady with ambulation.  Current Outpatient Medications  Medication Sig Dispense Refill  . Adalimumab (HUMIRA PEN) 40 MG/0.4ML PNKT Inject 40 mg into the skin every 14 (fourteen) days.    Marland Kitchen aspirin EC 81 MG tablet Take 81 mg by mouth daily.    Marland Kitchen BIOTIN PO Take 1 tablet by mouth daily.    . cholecalciferol (VITAMIN D) 1000 UNITS tablet Take 1,000 Units by mouth daily.    . Coenzyme Q10 (COQ-10) 100 MG CAPS Take 100 mg by mouth daily.    Marland Kitchen docusate sodium (COLACE) 50 MG capsule Take 50 mg by mouth daily as needed for mild constipation or moderate constipation.     Marland Kitchen escitalopram (LEXAPRO) 5 MG tablet Take 5 mg by mouth daily.    Marland Kitchen levothyroxine (SYNTHROID, LEVOTHROID) 50 MCG tablet Take 50 mcg by mouth daily. Except on Tuesday and Friday    . levothyroxine (SYNTHROID, LEVOTHROID) 75 MCG tablet Take 75 mcg by mouth 2 (two) times a week. On Tuesday and Friday    . loperamide (IMODIUM A-D) 2 MG tablet Take 1 tablet (2 mg total) by mouth 4 (four) times daily as needed for diarrhea or loose stools. 30 tablet 0  . metoprolol succinate (TOPROL-XL) 50 MG 24 hr tablet Take 50 mg by mouth daily. Take with or immediately following a meal.    . mirabegron ER (MYRBETRIQ) 50 MG TB24 tablet Take 50 mg by mouth daily.    . Multiple Vitamins-Minerals (MULTIVITAMIN WITH MINERALS) tablet Take 1 tablet by mouth daily.    . Probiotic Product (PROBIOTIC ADVANCED) CAPS Take 1 capsule by mouth 2 (two) times daily. 20 capsule 1  . trandolapril  (MAVIK) 1 MG tablet Take 1 mg by mouth daily.    Marland Kitchen triamcinolone (KENALOG) 0.025 % cream Apply 1 application topically 2 (two) times daily as needed. For psoriasis     No current facility-administered medications for this visit.      Past Medical History:  Diagnosis Date  . Acute cholecystitis   . Acute renal failure (ARF) (HCC)    history of    denies  . Arthritis   . Cerebrovascular disease   . Chronic diarrhea   . Complication of anesthesia    "doesnt take a lot " medicine  . Coronary artery disease    CABG  x4 LIMA to LAD saphenous vein graft to diagonal, saphenous vein graft to circumflex marginal, saphenous vein graft to distal RAD -- Endoscopic vein harvesting of conduit  . Fuch's endothelial dystrophy   . Hyperlipidemia   . Hypertension   . Hypothyroidism   . Pancreatitis     Past Surgical History:  Procedure Laterality Date  . APPENDECTOMY    . BACK SURGERY    . CARDIAC CATHETERIZATION  02/14/2007   Three-vessel coronary artery disease with small, diffusely diseased vessels -- normal LV function with EF of 55%  . CHOLECYSTECTOMY     11  . CHOLECYSTECTOMY, LAPAROSCOPIC  01/28/2010   Acute cholecystitis.  . CORONARY ARTERY BYPASS GRAFT  02/15/2007   x4 LIMA to LAD saphenous vein graft to diagonal, saphenous vein graft to circumflex marginal, saphenous vein graft to distal RAD -- Endoscopic vein harvesting of conduit  . ERCP W/ SPHINCTEROTOMY AND BALLOON DILATION  12/26/2007  . HEMORRHOID SURGERY  10/03/2007  . KNEE ARTHROSCOPY Right 6/14  . LUMBAR LAMINECTOMY/DECOMPRESSION MICRODISCECTOMY N/A 01/31/2014   Procedure: L3-5 DECOMPRESSION/REMOVAL OF FACET CYST;  Surgeon: Melina Schools, MD;  Location: [place];  Service: Orthopedics;  Laterality: N/A;    Social History   Socioeconomic History  . Marital status: Widowed    Spouse name: Not on file  . Number of children: Not on file  . Years of education: Not on file  . Highest education level: Not on file    Occupational History  . Not on file  Social Needs  . Financial resource strain: Not on file  . Food insecurity:    Worry: Not on file    Inability: Not on file  . Transportation needs:    Medical: Not on file    Non-medical: Not on file  Tobacco Use  . Smoking status: Former Smoker    Packs/day: 0.25    Years: 4.00    Pack years: 1.00    Types: Cigarettes    Last attempt to quit: 01/25/1953    Years since quitting: 65.1  . Smokeless tobacco: Never Used  Substance and Sexual Activity  . Alcohol use: No  . Drug use: No  . Sexual activity: Not on file  Lifestyle  . Physical activity:    Days per week: Not on file    Minutes per session: Not on file  . Stress: Not on file  Relationships  . Social connections:    Talks on phone: Not on file    Gets together: Not on file    Attends religious service: Not on file    Active member of club or organization: Not on file    Attends meetings of clubs or organizations: Not on file    Relationship status: Not on file  . Intimate partner violence:    Fear of current or ex partner: Not on file    Emotionally abused: Not on file    Physically abused: Not on file    Forced sexual activity: Not on file  Other Topics Concern  . Not on file  Social History Narrative  . Not on file    Family History  Problem Relation Age of Onset  . Heart attack Mother 82  . Heart attack Father 59       heavy smoker    ROS: no fevers or chills, productive cough, hemoptysis, dysphasia, odynophagia, melena, hematochezia, dysuria, hematuria, rash, seizure activity, orthopnea, PND, pedal edema, claudication. Remaining systems are negative.  Physical Exam: Well-developed well-nourished in no acute distress.  Skin is warm and dry.  HEENT is normal.  Neck is supple.  Chest is clear to auscultation with normal expansion.  Cardiovascular exam is regular rate and rhythm.  Abdominal exam nontender or distended. No masses palpated. Extremities show no  edema. neuro grossly intact  ECG-sinus rhythm at a rate of 59.  First-degree AV block.  Right bundle branch block.  Personally reviewed  A/P  1 coronary artery disease status post coronary artery bypass graft-patient doing well with no chest pain.  Continue medical therapy with aspirin.  She has declined statins previously.  2 carotid artery disease-continue aspirin.  3 hypertension-blood pressure is controlled.  Continue present medications.  4 hyperlipidemia-patient declines  statins.  Continue diet.  Kirk Ruths, MD

## 2018-03-14 ENCOUNTER — Encounter: Payer: Self-pay | Admitting: Cardiology

## 2018-03-14 ENCOUNTER — Ambulatory Visit (INDEPENDENT_AMBULATORY_CARE_PROVIDER_SITE_OTHER): Payer: Medicare Other | Admitting: Cardiology

## 2018-03-14 VITALS — BP 139/68 | HR 69 | Ht 61.0 in | Wt 155.4 lb

## 2018-03-14 DIAGNOSIS — I2510 Atherosclerotic heart disease of native coronary artery without angina pectoris: Secondary | ICD-10-CM | POA: Diagnosis not present

## 2018-03-14 DIAGNOSIS — I252 Old myocardial infarction: Secondary | ICD-10-CM

## 2018-03-14 DIAGNOSIS — I10 Essential (primary) hypertension: Secondary | ICD-10-CM

## 2018-03-14 DIAGNOSIS — E7800 Pure hypercholesterolemia, unspecified: Secondary | ICD-10-CM | POA: Diagnosis not present

## 2018-03-14 NOTE — Patient Instructions (Signed)
Your physician wants you to follow-up in: ONE YEAR WITH DR CRENSHAW You will receive a reminder letter in the mail two months in advance. If you don't receive a letter, please call our office to schedule the follow-up appointment.   If you need a refill on your cardiac medications before your next appointment, please call your pharmacy.  

## 2018-03-15 ENCOUNTER — Other Ambulatory Visit: Payer: Self-pay | Admitting: Cardiology

## 2018-05-02 DIAGNOSIS — D485 Neoplasm of uncertain behavior of skin: Secondary | ICD-10-CM | POA: Diagnosis not present

## 2018-05-02 DIAGNOSIS — L820 Inflamed seborrheic keratosis: Secondary | ICD-10-CM | POA: Diagnosis not present

## 2018-05-02 DIAGNOSIS — L409 Psoriasis, unspecified: Secondary | ICD-10-CM | POA: Diagnosis not present

## 2018-05-02 DIAGNOSIS — Z79899 Other long term (current) drug therapy: Secondary | ICD-10-CM | POA: Diagnosis not present

## 2018-05-11 DIAGNOSIS — K2900 Acute gastritis without bleeding: Secondary | ICD-10-CM | POA: Diagnosis not present

## 2018-05-11 DIAGNOSIS — R0789 Other chest pain: Secondary | ICD-10-CM | POA: Diagnosis not present

## 2018-05-16 DIAGNOSIS — H6122 Impacted cerumen, left ear: Secondary | ICD-10-CM | POA: Diagnosis not present

## 2018-05-16 DIAGNOSIS — K5909 Other constipation: Secondary | ICD-10-CM | POA: Diagnosis not present

## 2018-05-16 DIAGNOSIS — K2900 Acute gastritis without bleeding: Secondary | ICD-10-CM | POA: Diagnosis not present

## 2018-05-30 DIAGNOSIS — K5792 Diverticulitis of intestine, part unspecified, without perforation or abscess without bleeding: Secondary | ICD-10-CM | POA: Diagnosis not present

## 2018-06-26 DIAGNOSIS — R0781 Pleurodynia: Secondary | ICD-10-CM | POA: Diagnosis not present

## 2018-06-26 DIAGNOSIS — Y92009 Unspecified place in unspecified non-institutional (private) residence as the place of occurrence of the external cause: Secondary | ICD-10-CM | POA: Diagnosis not present

## 2018-06-26 DIAGNOSIS — W010XXA Fall on same level from slipping, tripping and stumbling without subsequent striking against object, initial encounter: Secondary | ICD-10-CM | POA: Diagnosis not present

## 2018-06-26 DIAGNOSIS — S2242XA Multiple fractures of ribs, left side, initial encounter for closed fracture: Secondary | ICD-10-CM | POA: Diagnosis not present

## 2018-06-26 DIAGNOSIS — S299XXA Unspecified injury of thorax, initial encounter: Secondary | ICD-10-CM | POA: Diagnosis not present

## 2018-08-08 DIAGNOSIS — E782 Mixed hyperlipidemia: Secondary | ICD-10-CM | POA: Diagnosis not present

## 2018-08-08 DIAGNOSIS — Z79899 Other long term (current) drug therapy: Secondary | ICD-10-CM | POA: Diagnosis not present

## 2018-08-08 DIAGNOSIS — E538 Deficiency of other specified B group vitamins: Secondary | ICD-10-CM | POA: Diagnosis not present

## 2018-08-15 DIAGNOSIS — E039 Hypothyroidism, unspecified: Secondary | ICD-10-CM | POA: Diagnosis not present

## 2018-08-15 DIAGNOSIS — I25810 Atherosclerosis of coronary artery bypass graft(s) without angina pectoris: Secondary | ICD-10-CM | POA: Diagnosis not present

## 2018-08-15 DIAGNOSIS — M48061 Spinal stenosis, lumbar region without neurogenic claudication: Secondary | ICD-10-CM | POA: Diagnosis not present

## 2018-08-15 DIAGNOSIS — L409 Psoriasis, unspecified: Secondary | ICD-10-CM | POA: Diagnosis not present

## 2018-09-05 DIAGNOSIS — K580 Irritable bowel syndrome with diarrhea: Secondary | ICD-10-CM | POA: Diagnosis not present

## 2018-09-05 DIAGNOSIS — K591 Functional diarrhea: Secondary | ICD-10-CM | POA: Diagnosis not present

## 2018-10-05 DIAGNOSIS — Z23 Encounter for immunization: Secondary | ICD-10-CM | POA: Diagnosis not present

## 2018-10-17 DIAGNOSIS — M48061 Spinal stenosis, lumbar region without neurogenic claudication: Secondary | ICD-10-CM | POA: Diagnosis not present

## 2018-10-17 DIAGNOSIS — I25810 Atherosclerosis of coronary artery bypass graft(s) without angina pectoris: Secondary | ICD-10-CM | POA: Diagnosis not present

## 2018-10-17 DIAGNOSIS — E039 Hypothyroidism, unspecified: Secondary | ICD-10-CM | POA: Diagnosis not present

## 2018-10-24 DIAGNOSIS — Z79899 Other long term (current) drug therapy: Secondary | ICD-10-CM | POA: Diagnosis not present

## 2018-10-24 DIAGNOSIS — L409 Psoriasis, unspecified: Secondary | ICD-10-CM | POA: Diagnosis not present

## 2018-10-24 DIAGNOSIS — L570 Actinic keratosis: Secondary | ICD-10-CM | POA: Diagnosis not present

## 2018-11-09 DIAGNOSIS — R079 Chest pain, unspecified: Secondary | ICD-10-CM | POA: Diagnosis not present

## 2018-11-09 DIAGNOSIS — W010XXA Fall on same level from slipping, tripping and stumbling without subsequent striking against object, initial encounter: Secondary | ICD-10-CM | POA: Diagnosis not present

## 2018-11-09 DIAGNOSIS — R0989 Other specified symptoms and signs involving the circulatory and respiratory systems: Secondary | ICD-10-CM | POA: Diagnosis not present

## 2018-11-09 DIAGNOSIS — R05 Cough: Secondary | ICD-10-CM | POA: Diagnosis not present

## 2018-11-09 DIAGNOSIS — S8011XA Contusion of right lower leg, initial encounter: Secondary | ICD-10-CM | POA: Diagnosis not present

## 2018-11-14 DIAGNOSIS — I25810 Atherosclerosis of coronary artery bypass graft(s) without angina pectoris: Secondary | ICD-10-CM | POA: Diagnosis not present

## 2018-11-14 DIAGNOSIS — R0789 Other chest pain: Secondary | ICD-10-CM | POA: Diagnosis not present

## 2018-11-23 ENCOUNTER — Ambulatory Visit (INDEPENDENT_AMBULATORY_CARE_PROVIDER_SITE_OTHER): Payer: Medicare Other | Admitting: Adult Health

## 2018-11-23 ENCOUNTER — Encounter: Payer: Self-pay | Admitting: Adult Health

## 2018-11-23 VITALS — BP 124/66 | HR 84 | Ht 61.0 in | Wt 144.0 lb

## 2018-11-23 DIAGNOSIS — I200 Unstable angina: Secondary | ICD-10-CM | POA: Diagnosis not present

## 2018-11-23 DIAGNOSIS — R0602 Shortness of breath: Secondary | ICD-10-CM

## 2018-11-23 DIAGNOSIS — I519 Heart disease, unspecified: Secondary | ICD-10-CM | POA: Diagnosis not present

## 2018-11-23 DIAGNOSIS — R0789 Other chest pain: Secondary | ICD-10-CM | POA: Diagnosis not present

## 2018-11-23 DIAGNOSIS — I2510 Atherosclerotic heart disease of native coronary artery without angina pectoris: Secondary | ICD-10-CM | POA: Diagnosis not present

## 2018-11-23 DIAGNOSIS — I252 Old myocardial infarction: Secondary | ICD-10-CM | POA: Diagnosis not present

## 2018-11-23 MED ORDER — METOPROLOL SUCCINATE ER 25 MG PO TB24: 25.0000 mg | ORAL_TABLET | Freq: Every day | ORAL | 6 refills | Status: DC

## 2018-11-23 NOTE — Patient Instructions (Signed)
Medication Instructions:  TAKE METOPROLOL XL 25MG  DAILY  If you need a refill on your cardiac medications before your next appointment, please call your pharmacy.  Labwork: If you have labs (blood work) drawn today and your tests are completely normal, you will receive your results only by: Marland Kitchen MyChart Message (if you have MyChart) OR . A paper copy in the mail If you have any lab test that is abnormal or we need to change your treatment, we will call you to review the results.  Testing/Procedures: Echocardiogram - Your physician has requested that you have an echocardiogram. Echocardiography is a painless test that uses sound waves to create images of your heart. It provides your doctor with information about the size and shape of your heart and how well your heart's chambers and valves are working. This procedure takes approximately one hour. There are no restrictions for this procedure. This will be performed at our [facility] location - [address].  Follow-Up: You will need a follow up appointment in 1 MONTH. You may see  DR Orma Flaming, DNP, AACC or one of the following Advanced Practice Providers on your designated Care Team:  Kerin Ransom, [facility], you and your health needs are our priority.  As part of our continuing mission to provide you with exceptional heart care, we have created designated Provider Care Teams.  These Care Teams include your primary Cardiologist (physician) and Advanced Practice Providers (APPs -  Physician Assistants and Nurse Practitioners) who all work together to provide you with the care you need, when you need it.

## 2018-11-23 NOTE — Progress Notes (Signed)
Cardiology Office Note   Date:  11/23/2018   ID:  [name], DOB May 01, 1929, MRN [id number]  PCP:  Rusty Aus, MD  Cardiologist: Dr. Stanford Breed   Chief Complaint  Patient presents with  . Chest Pain  . Shortness of Breath  . Coronary Artery Disease     History of Present Illness: [name] is a 84 y.o. female who presents for ongoing assessment and management of CAD, with hx of CABG, Myoview in 2013 revealed no ischemia or infarction.. She called our office today with complaints of chest pain and asked for an appointment.   She comes today with a lengthy story about all of her symptoms. She has seen her PCP Dr. Sabra Heck and was given a "new medication" which she does not remember the name of, but took as directed. She states after taking it, she laid down to watch television and the next thing she knew, she was on the floor, the phone had been knocked off of the table.   She followed up with Dr. Sabra Heck today. She states that she stopped all of her medications except for levothyroxine 50 mcg and ASA.   She states that ever since she had the "crud" a few months ago, she has felt a constant pressure in her chest. She denies DOE, she remains unsteady on her feet. She has not had another episode of falling out of bed, and not remembering how she got there.    Past Medical History:  Diagnosis Date  . Acute cholecystitis   . Acute renal failure (ARF) (HCC)    history of    denies  . Arthritis   . Cerebrovascular disease   . Chronic diarrhea   . Complication of anesthesia    "doesnt take a lot " medicine  . Coronary artery disease    CABG  x4 LIMA to LAD saphenous vein graft to diagonal, saphenous vein graft to circumflex marginal, saphenous vein graft to distal RAD -- Endoscopic vein harvesting of conduit  . Fuch's endothelial dystrophy   . Hyperlipidemia   . Hypertension   . Hypothyroidism   . Pancreatitis     Past Surgical History:  Procedure Laterality Date  .  APPENDECTOMY    . BACK SURGERY    . CARDIAC CATHETERIZATION  02/14/2007   Three-vessel coronary artery disease with small, diffusely diseased vessels -- normal LV function with EF of 55%  . CHOLECYSTECTOMY     11  . CHOLECYSTECTOMY, LAPAROSCOPIC  01/28/2010   Acute cholecystitis.  . CORONARY ARTERY BYPASS GRAFT  02/15/2007   x4 LIMA to LAD saphenous vein graft to diagonal, saphenous vein graft to circumflex marginal, saphenous vein graft to distal RAD -- Endoscopic vein harvesting of conduit  . ERCP W/ SPHINCTEROTOMY AND BALLOON DILATION  12/26/2007  . HEMORRHOID SURGERY  10/03/2007  . KNEE ARTHROSCOPY Right 6/14  . LUMBAR LAMINECTOMY/DECOMPRESSION MICRODISCECTOMY N/A 01/31/2014   Procedure: L3-5 DECOMPRESSION/REMOVAL OF FACET CYST;  Surgeon: Melina Schools, MD;  Location: [place];  Service: Orthopedics;  Laterality: N/A;     Current Outpatient Medications  Medication Sig Dispense Refill  . aspirin EC 81 MG tablet Take 81 mg by mouth daily.    Marland Kitchen levothyroxine (SYNTHROID, LEVOTHROID) 50 MCG tablet Take 50 mcg by mouth daily. Except on Tuesday and Friday    . metoprolol succinate (TOPROL-XL) 50 MG 24 hr tablet Take 50 mg by mouth daily. Take with or immediately following a meal.     No current facility-administered medications  for this visit.     Allergies:   Phenytoin sodium extended; Epinephrine; Nitroglycerin; Valium; and Hibiclens [chlorhexidine gluconate]    Social History:  The patient  reports that she quit smoking about 65 years ago. Her smoking use included cigarettes. She has a 1.00 pack-year smoking history. She has never used smokeless tobacco. She reports that she does not drink alcohol or use drugs.   Family History:  The patient's family history includes Heart attack (age of onset: 29) in her father; Heart attack (age of onset: 39) in her mother.    ROS: All other systems are reviewed and negative. Unless otherwise mentioned in H&P    PHYSICAL EXAM: VS:  BP 124/66    Pulse 84   Ht 5\' 1"  (1.549 m)   Wt 144 lb (65.3 kg)   SpO2 95%   BMI 27.21 kg/m  , BMI Body mass index is 27.21 kg/m. GEN: Well nourished, well developed, in no acute distress HEENT: normal Neck: no JVD, carotid bruits, or masses Cardiac: RRR; tachycardic, no murmurs, rubs, or gallops,no edema  Respiratory:  Clear to auscultation bilaterally, normal work of breathing, but breathing hard when she is talking.  GI: soft, nontender, nondistended, + BS MS: no deformity or atrophy Skin: warm and dry, no rash Neuro:  Strength and sensation are intact Psych: euthymic mood, full affect   EKG: SR with PAC's rate of 84 bpm. Apical pulse is 110 bpm.   Recent Labs: No results found for requested labs within last 8760 hours.    Lipid Panel    Component Value Date/Time   CHOL 148 02/19/2009 1142   TRIG 279 (HH) 02/19/2009 1142   HDL 34.0 (L) 02/19/2009 1142   CHOLHDL 4.4 CALC 02/19/2009 1142   VLDL 56 (H) 02/19/2009 1142   LDLCALC (H) 09/23/2007 0530    144        Total Cholesterol/HDL:CHD Risk Coronary Heart Disease Risk Table                     Men   Women  1/2 Average Risk   3.4   3.3   LDLDIRECT 74.1 02/19/2009 1142      Wt Readings from Last 3 Encounters:  11/23/18 144 lb (65.3 kg)  03/14/18 155 lb 6.4 oz (70.5 kg)  06/08/17 150 lb (68 kg)      Other studies Reviewed:  Echocardiogram 02/12/2018  SUMMARY - Overall left ventricular systolic function was normal. Left    ventricular ejection fraction was estimated , range being 55    % to 60 %. There were no left ventricular regional wall    motion abnormalities. - The aortic valve was mildly calcified. - There was mild mitral annular calcification. - The left atrium was mildly dilated.  ASSESSMENT AND PLAN:  1. Chronic chest pressure Uncertain etiology. She having some DOE.  She will have an echocardiogram for evaluation of changes in LV function.   2. CAD: Hx of CABG. EKG is unchanged except  for frequent PAC's. She has taken herself off all of her medications but her thyroid medications. I am concerned about the sudden cessation of BB. As she is tachycardic, I will restart her on lower dose of metoprolol XL 25 mg daily, reduced from 50 mg XL daily. She is willing to take this. This may help with her breathing.   3. Medical Non-compliance: She feels she is on too many medications  She has stopped her antidepressant, trandolapril, and is avoiding taking  Humira. I will defer to PCP for follow up.    Current medicines are reviewed at length with the patient today.    Labs/ tests ordered today include:  Echo  Phill Myron. [place] Pugh, ANP, AACC   11/23/2018 12:07 PM    [facility] [address] Office [phone] Fax [phone]

## 2018-12-05 ENCOUNTER — Other Ambulatory Visit: Payer: Self-pay

## 2018-12-05 ENCOUNTER — Ambulatory Visit (HOSPITAL_COMMUNITY): Payer: Medicare Other | Attending: Cardiovascular Disease

## 2018-12-05 DIAGNOSIS — I519 Heart disease, unspecified: Secondary | ICD-10-CM | POA: Diagnosis not present

## 2018-12-05 DIAGNOSIS — R0602 Shortness of breath: Secondary | ICD-10-CM | POA: Diagnosis not present

## 2018-12-08 ENCOUNTER — Telehealth: Payer: Self-pay | Admitting: Adult Health

## 2018-12-08 NOTE — Telephone Encounter (Signed)
New Message   Patients son Ronalee Belts is calling to obtain echocardiogram results. Please call to discuss.

## 2018-12-08 NOTE — Telephone Encounter (Signed)
The patient'S son MIKE has been notified of the result and verbalized understanding.  All questions (if any) were answered. Raiford Simmonds, RN 12/08/2018 11:43 AM  ROUTED ECHO REPORT TO PRIMARY PER SON REQUEST

## 2018-12-28 ENCOUNTER — Ambulatory Visit: Payer: Self-pay | Admitting: Adult Health

## 2019-02-02 NOTE — Progress Notes (Signed)
HPI: FU CAD. She had coronary artery bypass and graft in 2008. A Myoview in February 2013 showed no ischemia or infarction and an ejection fraction of 81%. Last carotid Dopplers were performed in December of 2010. There was 0-39% bilateral stenosis.  Echocardiogram December 2019 showed ejection fraction 55 to 53%, mild diastolic dysfunction and moderate RV dysfunction.  Since I last saw her,she denies dyspnea, chest pain, palpitations or syncope.  She did fall yesterday losing her balance and required treatment of laceration in her right occipital area.  Current Outpatient Medications  Medication Sig Dispense Refill  . aspirin EC 81 MG tablet Take 81 mg by mouth daily.    Marland Kitchen levothyroxine (SYNTHROID, LEVOTHROID) 50 MCG tablet Take 50 mcg by mouth daily.     . metoprolol succinate (TOPROL-XL) 25 MG 24 hr tablet Take 1 tablet (25 mg total) by mouth daily. Take with or immediately following a meal. 30 tablet 6   No current facility-administered medications for this visit.      Past Medical History:  Diagnosis Date  . Acute cholecystitis   . Acute renal failure (ARF) (HCC)    history of    denies  . Arthritis   . Cerebrovascular disease   . Chronic diarrhea   . Complication of anesthesia    "doesnt take a lot " medicine  . Coronary artery disease    CABG  x4 LIMA to LAD saphenous vein graft to diagonal, saphenous vein graft to circumflex marginal, saphenous vein graft to distal RAD -- Endoscopic vein harvesting of conduit  . Fuch's endothelial dystrophy   . Hyperlipidemia   . Hypertension   . Hypothyroidism   . Pancreatitis     Past Surgical History:  Procedure Laterality Date  . APPENDECTOMY    . BACK SURGERY    . CARDIAC CATHETERIZATION  02/14/2007   Three-vessel coronary artery disease with small, diffusely diseased vessels -- normal LV function with EF of 55%  . CHOLECYSTECTOMY     11  . CHOLECYSTECTOMY, LAPAROSCOPIC  01/28/2010   Acute cholecystitis.  . CORONARY  ARTERY BYPASS GRAFT  02/15/2007   x4 LIMA to LAD saphenous vein graft to diagonal, saphenous vein graft to circumflex marginal, saphenous vein graft to distal RAD -- Endoscopic vein harvesting of conduit  . ERCP W/ SPHINCTEROTOMY AND BALLOON DILATION  12/26/2007  . HEMORRHOID SURGERY  10/03/2007  . KNEE ARTHROSCOPY Right 6/14  . LUMBAR LAMINECTOMY/DECOMPRESSION MICRODISCECTOMY N/A 01/31/2014   Procedure: L3-5 DECOMPRESSION/REMOVAL OF FACET CYST;  Surgeon: Melina Schools, MD;  Location: [facility];  Service: Orthopedics;  Laterality: N/A;    Social History   Socioeconomic History  . Marital status: Widowed    Spouse name: Not on file  . Number of children: Not on file  . Years of education: Not on file  . Highest education level: Not on file  Occupational History  . Not on file  Social Needs  . Financial resource strain: Not on file  . Food insecurity:    Worry: Not on file    Inability: Not on file  . Transportation needs:    Medical: Not on file    Non-medical: Not on file  Tobacco Use  . Smoking status: Former Smoker    Packs/day: 0.25    Years: 4.00    Pack years: 1.00    Types: Cigarettes    Last attempt to quit: 01/25/1953    Years since quitting: 66.1  . Smokeless tobacco: Never Used  Substance  and Sexual Activity  . Alcohol use: No  . Drug use: No  . Sexual activity: Not on file  Lifestyle  . Physical activity:    Days per week: Not on file    Minutes per session: Not on file  . Stress: Not on file  Relationships  . Social connections:    Talks on phone: Not on file    Gets together: Not on file    Attends religious service: Not on file    Active member of club or organization: Not on file    Attends meetings of clubs or organizations: Not on file    Relationship status: Not on file  . Intimate partner violence:    Fear of current or ex partner: Not on file    Emotionally abused: Not on file    Physically abused: Not on file    Forced sexual activity: Not on  file  Other Topics Concern  . Not on file  Social History Narrative  . Not on file    Family History  Problem Relation Age of Onset  . Heart attack Mother 59  . Heart attack Father 40       heavy smoker    ROS: no fevers or chills, productive cough, hemoptysis, dysphasia, odynophagia, melena, hematochezia, dysuria, hematuria, rash, seizure activity, orthopnea, PND, pedal edema, claudication. Remaining systems are negative.  Physical Exam: Well-developed well-nourished in no acute distress.  Skin is warm and dry.  HEENT with small laceration right occipital area Neck is supple.  Chest is clear to auscultation with normal expansion.  Cardiovascular exam is regular rate and rhythm.  Abdominal exam nontender or distended. No masses palpated. Extremities show no edema. neuro grossly intact  A/P  1 coronary artery disease status post coronary artery bypass graft-patient is doing well with no recurrent chest pain.  Continue medical therapy with aspirin.  She declines statins.  2 hyperlipidemia-patient declines statins.  Continue diet.  3 hypertension-patient's blood pressure is controlled.  Continue present medications and follow.  4 carotid artery disease-continue aspirin.  Kirk Ruths, MD

## 2019-02-15 ENCOUNTER — Encounter: Payer: Self-pay | Admitting: Emergency Medicine

## 2019-02-15 ENCOUNTER — Emergency Department: Payer: Medicare Other

## 2019-02-15 ENCOUNTER — Emergency Department
Admission: EM | Admit: 2019-02-15 | Discharge: 2019-02-15 | Disposition: A | Discharge status: Home / Self Care | Payer: Medicare Other | Attending: Student in an Organized Health Care Education/Training Program | Admitting: Student in an Organized Health Care Education/Training Program

## 2019-02-15 DIAGNOSIS — S0990XA Unspecified injury of head, initial encounter: Secondary | ICD-10-CM | POA: Diagnosis present

## 2019-02-15 DIAGNOSIS — Z951 Presence of aortocoronary bypass graft: Secondary | ICD-10-CM | POA: Insufficient documentation

## 2019-02-15 DIAGNOSIS — Z87891 Personal history of nicotine dependence: Secondary | ICD-10-CM | POA: Insufficient documentation

## 2019-02-15 DIAGNOSIS — E039 Hypothyroidism, unspecified: Secondary | ICD-10-CM | POA: Diagnosis not present

## 2019-02-15 DIAGNOSIS — S0101XA Laceration without foreign body of scalp, initial encounter: Secondary | ICD-10-CM | POA: Diagnosis not present

## 2019-02-15 DIAGNOSIS — I2510 Atherosclerotic heart disease of native coronary artery without angina pectoris: Secondary | ICD-10-CM | POA: Insufficient documentation

## 2019-02-15 DIAGNOSIS — Y939 Activity, unspecified: Secondary | ICD-10-CM | POA: Insufficient documentation

## 2019-02-15 DIAGNOSIS — Z79899 Other long term (current) drug therapy: Secondary | ICD-10-CM | POA: Diagnosis not present

## 2019-02-15 DIAGNOSIS — Y999 Unspecified external cause status: Secondary | ICD-10-CM | POA: Insufficient documentation

## 2019-02-15 DIAGNOSIS — Z7982 Long term (current) use of aspirin: Secondary | ICD-10-CM | POA: Insufficient documentation

## 2019-02-15 DIAGNOSIS — W19XXXA Unspecified fall, initial encounter: Secondary | ICD-10-CM | POA: Insufficient documentation

## 2019-02-15 DIAGNOSIS — Y929 Unspecified place or not applicable: Secondary | ICD-10-CM | POA: Diagnosis not present

## 2019-02-15 DIAGNOSIS — I10 Essential (primary) hypertension: Secondary | ICD-10-CM | POA: Insufficient documentation

## 2019-02-15 NOTE — ED Triage Notes (Signed)
Patient presents to the ED with a laceration to the back of her head post fall.  Patient states she was pulling a shade down and she lost her balance and fell.  Patient is in no obvious distress at this time.  Denies passing out.  Denies taking blood thinners and denies weakness or dizziness.

## 2019-02-15 NOTE — Discharge Instructions (Signed)
May mildly shampoo hair after 2 days.

## 2019-02-15 NOTE — ED Notes (Signed)
See triage note  States she hit her head s/p fall  Has laceration to head   No LOC

## 2019-02-15 NOTE — ED Provider Notes (Signed)
[facility] Emergency Department Provider Note   ____________________________________________   First MD Initiated Contact with Patient 02/15/19 1422     (approximate)  I have reviewed the triage vital signs and the nursing notes.   HISTORY  Chief Complaint Head Injury    HPI [name] is a 85 y.o. female patient presents a laceration to back of his status post fall.  Patient that she was pulled to see the loss of balance and fell.  Patient denies LOC.  Patient denies headache vision disturbance or weakness.  Patient does not take blood thinners.  Patient rates her pain as a 3/10.  Patient described the pain as "sore".  Bleeding is controlled with direct pressure.    Past Medical History:  Diagnosis Date  . Acute cholecystitis   . Acute renal failure (ARF) (HCC)    history of    denies  . Arthritis   . Cerebrovascular disease   . Chronic diarrhea   . Complication of anesthesia    "doesnt take a lot " medicine  . Coronary artery disease    CABG  x4 LIMA to LAD saphenous vein graft to diagonal, saphenous vein graft to circumflex marginal, saphenous vein graft to distal RAD -- Endoscopic vein harvesting of conduit  . Fuch's endothelial dystrophy   . Hyperlipidemia   . Hypertension   . Hypothyroidism   . Pancreatitis     Patient Active Problem List   Diagnosis Date Noted  . S/P lumbar laminectomy 02/14/2014  . PNA (pneumonia) 02/14/2014  . Psoriasis 02/07/2014  . CAD (coronary artery disease) 02/07/2014  . Spinal stenosis 01/31/2014  . Preop cardiovascular exam 12/22/2012  . HYPOTHYROIDISM 03/11/2009  . HYPERLIPIDEMIA 03/11/2009  . Essential hypertension 03/11/2009  . CORONARY ARTERY DISEASE 03/11/2009  . Cerebrovascular disease 03/11/2009  . CHEST PAIN 03/11/2009    Past Surgical History:  Procedure Laterality Date  . APPENDECTOMY    . BACK SURGERY    . CARDIAC CATHETERIZATION  02/14/2007   Three-vessel coronary artery  disease with small, diffusely diseased vessels -- normal LV function with EF of 55%  . CHOLECYSTECTOMY     11  . CHOLECYSTECTOMY, LAPAROSCOPIC  01/28/2010   Acute cholecystitis.  . CORONARY ARTERY BYPASS GRAFT  02/15/2007   x4 LIMA to LAD saphenous vein graft to diagonal, saphenous vein graft to circumflex marginal, saphenous vein graft to distal RAD -- Endoscopic vein harvesting of conduit  . ERCP W/ SPHINCTEROTOMY AND BALLOON DILATION  12/26/2007  . HEMORRHOID SURGERY  10/03/2007  . KNEE ARTHROSCOPY Right 6/14  . LUMBAR LAMINECTOMY/DECOMPRESSION MICRODISCECTOMY N/A 01/31/2014   Procedure: L3-5 DECOMPRESSION/REMOVAL OF FACET CYST;  Surgeon: Melina Schools, MD;  Location: [facility];  Service: Orthopedics;  Laterality: N/A;    Prior to Admission medications   Medication Sig Start Date End Date Taking? Authorizing Provider  aspirin EC 81 MG tablet Take 81 mg by mouth daily.    [provider]  levothyroxine (SYNTHROID, LEVOTHROID) 50 MCG tablet Take 50 mcg by mouth daily. Except on Tuesday and Friday    [provider]  metoprolol succinate (TOPROL-XL) 25 MG 24 hr tablet Take 1 tablet (25 mg total) by mouth daily. Take with or immediately following a meal. 11/23/18   Lendon Colonel, NP    Allergies Phenytoin sodium extended; Epinephrine; Nitroglycerin; Valium; and Hibiclens [chlorhexidine gluconate]  Family History  Problem Relation Age of Onset  . Heart attack Mother 48  . Heart attack Father 79  heavy smoker    Social History Social History   Tobacco Use  . Smoking status: Former Smoker    Packs/day: 0.25    Years: 4.00    Pack years: 1.00    Types: Cigarettes    Last attempt to quit: 01/25/1953    Years since quitting: 66.1  . Smokeless tobacco: Never Used  Substance Use Topics  . Alcohol use: No  . Drug use: No    Review of Systems Constitutional: No fever/chills Eyes: No visual changes. ENT: No sore throat. Cardiovascular: Denies chest  pain. Respiratory: Denies shortness of breath. Gastrointestinal: No abdominal pain.  No nausea, no vomiting.  No diarrhea.  No constipation. Genitourinary: Negative for dysuria. Musculoskeletal: Negative for back pain. Skin: Negative for rash. Neurological: Negative for headaches, focal weakness or numbness. Endocrine:  Hyperlipidemia, hypertension.  Hypothyroidism. Hematological/Lymphatic:  Allergic/Immunilogical: See allergy list. ____________________________________________   PHYSICAL EXAM:  VITAL SIGNS: ED Triage Vitals  Enc Vitals Group     BP 02/15/19 1232 139/77     Pulse Rate 02/15/19 1232 88     Resp 02/15/19 1232 20     Temp 02/15/19 1232 97.6 F (36.4 C)     Temp Source 02/15/19 1232 Oral     SpO2 02/15/19 1232 95 %     Weight 02/15/19 1214 146 lb (66.2 kg)     Height 02/15/19 1214 5\' 1"  (1.549 m)     Head Circumference --      Peak Flow --      Pain Score 02/15/19 1213 3     Pain Loc --      Pain Edu? --      Excl. in [place]? --    Constitutional: Alert and oriented. Well appearing and in no acute distress. Eyes: Conjunctivae are normal. PERRL. EOMI. Head: Hematoma and laceration. Nose: No congestion/rhinnorhea. Mouth/Throat: Mucous membranes are moist.  Oropharynx non-erythematous. Neck: No stridor.  No cervical spine tenderness to palpation. Cardiovascular: Normal rate, regular rhythm. Grossly normal heart sounds.  Good peripheral circulation. Respiratory: Normal respiratory effort.  No retractions. Lungs CTAB. Musculoskeletal: No lower extremity tenderness nor edema.  No joint effusions. Neurologic:  Normal speech and language. No gross focal neurologic deficits are appreciated. No gait instability. Skin:  Skin is warm, dry and intact. No rash noted. Psychiatric: Mood and affect are normal. Speech and behavior are normal.  ____________________________________________   LABS (all labs ordered are listed, but only abnormal results are displayed)  Labs  Reviewed - No data to display ____________________________________________  EKG   ____________________________________________  RADIOLOGY  ED MD interpretation:    Official radiology report(s): Ct Head Wo Contrast  Result Date: 02/15/2019 CLINICAL DATA:  85 year old female with head and neck pain following fall. Initial encounter. EXAM: CT HEAD WITHOUT CONTRAST CT CERVICAL SPINE WITHOUT CONTRAST TECHNIQUE: Multidetector CT imaging of the head and cervical spine was performed following the standard protocol without intravenous contrast. Multiplanar CT image reconstructions of the cervical spine were also generated. COMPARISON:  04/22/2014 head CT and 07/25/2012 cervical spine radiographs FINDINGS: CT HEAD FINDINGS Brain: No evidence of acute infarction, hemorrhage, hydrocephalus, extra-axial collection or mass lesion/mass effect. Atrophy and chronic small-vessel white matter ischemic changes are again noted. Vascular: Carotid and vertebral atherosclerotic calcifications noted. Skull: Normal. Negative for fracture or focal lesion. Sinuses/Orbits: No acute finding. Other: Posterior RIGHT scalp soft tissue swelling noted. CT CERVICAL SPINE FINDINGS Alignment: No acute subluxation. Skull base and vertebrae: No acute fracture. No primary bone lesion or focal pathologic process.  Soft tissues and spinal canal: No prevertebral fluid or swelling. No visible canal hematoma. Disc levels: Multilevel degenerative disc disease, spondylosis and facet arthropathy, greatest at L4-5 and L5-S1. Upper chest: No acute abnormality Other: None IMPRESSION: 1. No evidence of acute intracranial abnormality. Atrophy and chronic small-vessel white matter ischemic changes. 2. Posterior RIGHT scalp soft tissue swelling without fracture 3. No static evidence of acute injury to the cervical spine. Degenerative changes again noted. Electronically Signed   By: Margarette Canada M.D.   On: 02/15/2019 13:11   Ct Cervical Spine Wo  Contrast  Result Date: 02/15/2019 CLINICAL DATA:  85 year old female with head and neck pain following fall. Initial encounter. EXAM: CT HEAD WITHOUT CONTRAST CT CERVICAL SPINE WITHOUT CONTRAST TECHNIQUE: Multidetector CT imaging of the head and cervical spine was performed following the standard protocol without intravenous contrast. Multiplanar CT image reconstructions of the cervical spine were also generated. COMPARISON:  04/22/2014 head CT and 07/25/2012 cervical spine radiographs FINDINGS: CT HEAD FINDINGS Brain: No evidence of acute infarction, hemorrhage, hydrocephalus, extra-axial collection or mass lesion/mass effect. Atrophy and chronic small-vessel white matter ischemic changes are again noted. Vascular: Carotid and vertebral atherosclerotic calcifications noted. Skull: Normal. Negative for fracture or focal lesion. Sinuses/Orbits: No acute finding. Other: Posterior RIGHT scalp soft tissue swelling noted. CT CERVICAL SPINE FINDINGS Alignment: No acute subluxation. Skull base and vertebrae: No acute fracture. No primary bone lesion or focal pathologic process. Soft tissues and spinal canal: No prevertebral fluid or swelling. No visible canal hematoma. Disc levels: Multilevel degenerative disc disease, spondylosis and facet arthropathy, greatest at L4-5 and L5-S1. Upper chest: No acute abnormality Other: None IMPRESSION: 1. No evidence of acute intracranial abnormality. Atrophy and chronic small-vessel white matter ischemic changes. 2. Posterior RIGHT scalp soft tissue swelling without fracture 3. No static evidence of acute injury to the cervical spine. Degenerative changes again noted. Electronically Signed   By: Margarette Canada M.D.   On: 02/15/2019 13:11    ____________________________________________   PROCEDURES  Procedure(s) performed (including Critical Care):  Marland KitchenMarland KitchenLaceration Repair Date/Time: 02/15/2019 2:38 PM Performed by: Sable Feil, PA-C Authorized by: Sable Feil, PA-C    Consent:    Consent obtained:  Verbal   Consent given by:  Patient   Risks discussed:  Infection, pain and need for additional repair Laceration details:    Location:  Scalp   Scalp location:  R parietal   Length (cm):  0.5 Repair type:    Repair type:  Simple Pre-procedure details:    Preparation:  Patient was prepped and draped in usual sterile fashion Exploration:    Hemostasis achieved with:  Direct pressure   Contaminated: no   Treatment:    Area cleansed with:  Betadine and saline   Amount of cleaning:  Standard Skin repair:    Repair method:  Tissue adhesive Approximation:    Approximation:  Close Post-procedure details:    Patient tolerance of procedure:  Tolerated well, no immediate complications     ____________________________________________   INITIAL IMPRESSION / ASSESSMENT AND PLAN / ED COURSE  As part of my medical decision making, I reviewed the following data within the [facility]     Patient presents with hematoma and laceration superior aspect of scalp.  Discussed discussed no acute findings on CT of the head and neck.  Wound was surgical clean and closed with Dermabond.  Patient given discharge care instructions advised return back to ED if wound reopens for healing is complete.  ____________________________________________   FINAL CLINICAL IMPRESSION(S) / ED DIAGNOSES  Final diagnoses:  Scalp laceration, initial encounter     ED Discharge Orders    None       Note:  This document was prepared using Dragon voice recognition software and may include unintentional dictation errors.    Sable Feil, PA-C 02/15/19 1439    Merlyn Lot, MD 02/15/19 1520

## 2019-02-16 ENCOUNTER — Encounter: Payer: Self-pay | Admitting: Cardiology

## 2019-02-16 ENCOUNTER — Ambulatory Visit (INDEPENDENT_AMBULATORY_CARE_PROVIDER_SITE_OTHER): Payer: Medicare Other | Admitting: Cardiology

## 2019-02-16 VITALS — BP 130/70 | HR 80 | Ht 61.0 in | Wt 145.0 lb

## 2019-02-16 DIAGNOSIS — E7800 Pure hypercholesterolemia, unspecified: Secondary | ICD-10-CM | POA: Diagnosis not present

## 2019-02-16 DIAGNOSIS — I2510 Atherosclerotic heart disease of native coronary artery without angina pectoris: Secondary | ICD-10-CM | POA: Diagnosis not present

## 2019-02-16 DIAGNOSIS — I10 Essential (primary) hypertension: Secondary | ICD-10-CM

## 2019-02-16 NOTE — Patient Instructions (Signed)

## 2019-07-18 ENCOUNTER — Other Ambulatory Visit: Payer: Self-pay | Admitting: Internal Medicine

## 2019-07-18 DIAGNOSIS — Z1231 Encounter for screening mammogram for malignant neoplasm of breast: Secondary | ICD-10-CM

## 2020-02-18 NOTE — Progress Notes (Signed)
HPI: FU CAD. She had coronary artery bypass and graft in 2008. A Myoview in February 2013 showed no ischemia or infarction and an ejection fraction of 81%. Last carotid Dopplers were performed in December of 2010. There was 0-39% bilateral stenosis.  Echocardiogram December 2019 showed ejection fraction 55 to 123456, mild diastolic dysfunction and moderate RV dysfunction.  Since I last saw her,there is no dyspnea, chest pain, palpitations or syncope.  Current Outpatient Medications  Medication Sig Dispense Refill  . Adalimumab (HUMIRA [place]) 40 Units every 14 (fourteen) days.    Marland Kitchen aspirin EC 81 MG tablet Take 81 mg by mouth daily.    Marland Kitchen levothyroxine (SYNTHROID, LEVOTHROID) 50 MCG tablet Take 50 mcg by mouth daily.     . Multiple Vitamin (MULTIVITAMIN) tablet Take 1 tablet by mouth daily.     No current facility-administered medications for this visit.     Past Medical History:  Diagnosis Date  . Acute cholecystitis   . Acute renal failure (ARF) (HCC)    history of    denies  . Arthritis   . Cerebrovascular disease   . Chronic diarrhea   . Complication of anesthesia    "doesnt take a lot " medicine  . Coronary artery disease    CABG  x4 LIMA to LAD saphenous vein graft to diagonal, saphenous vein graft to circumflex marginal, saphenous vein graft to distal RAD -- Endoscopic vein harvesting of conduit  . Fuch's endothelial dystrophy   . Hyperlipidemia   . Hypertension   . Hypothyroidism   . Pancreatitis     Past Surgical History:  Procedure Laterality Date  . APPENDECTOMY    . BACK SURGERY    . CARDIAC CATHETERIZATION  02/14/2007   Three-vessel coronary artery disease with small, diffusely diseased vessels -- normal LV function with EF of 55%  . CHOLECYSTECTOMY     11  . CHOLECYSTECTOMY, LAPAROSCOPIC  01/28/2010   Acute cholecystitis.  . CORONARY ARTERY BYPASS GRAFT  02/15/2007   x4 LIMA to LAD saphenous vein graft to diagonal, saphenous vein graft to circumflex  marginal, saphenous vein graft to distal RAD -- Endoscopic vein harvesting of conduit  . ERCP W/ SPHINCTEROTOMY AND BALLOON DILATION  12/26/2007  . HEMORRHOID SURGERY  10/03/2007  . KNEE ARTHROSCOPY Right 6/14  . LUMBAR LAMINECTOMY/DECOMPRESSION MICRODISCECTOMY N/A 01/31/2014   Procedure: L3-5 DECOMPRESSION/REMOVAL OF FACET CYST;  Surgeon: Melina Schools, MD;  Location: [facility];  Service: Orthopedics;  Laterality: N/A;    Social History   Socioeconomic History  . Marital status: Widowed    Spouse name: Not on file  . Number of children: Not on file  . Years of education: Not on file  . Highest education level: Not on file  Occupational History  . Not on file  Tobacco Use  . Smoking status: Former Smoker    Packs/day: 0.25    Years: 4.00    Pack years: 1.00    Types: Cigarettes    Quit date: 01/25/1953    Years since quitting: 67.1  . Smokeless tobacco: Never Used  Substance and Sexual Activity  . Alcohol use: No  . Drug use: No  . Sexual activity: Not on file  Other Topics Concern  . Not on file  Social History Narrative  . Not on file   Social Determinants of Health   Financial Resource Strain:   . Difficulty of Paying Living Expenses: Not on file  Food Insecurity:   . Worried About Running  Out of Food in the Last Year: Not on file  . Ran Out of Food in the Last Year: Not on file  Transportation Needs:   . Lack of Transportation (Medical): Not on file  . Lack of Transportation (Non-Medical): Not on file  Physical Activity:   . Days of Exercise per Week: Not on file  . Minutes of Exercise per Session: Not on file  Stress:   . Feeling of Stress : Not on file  Social Connections:   . Frequency of Communication with Friends and Family: Not on file  . Frequency of Social Gatherings with Friends and Family: Not on file  . Attends Religious Services: Not on file  . Active Member of Clubs or Organizations: Not on file  . Attends Archivist Meetings: Not on file    . Marital Status: Not on file  Intimate Partner Violence:   . Fear of Current or Ex-Partner: Not on file  . Emotionally Abused: Not on file  . Physically Abused: Not on file  . Sexually Abused: Not on file    Family History  Problem Relation Age of Onset  . Heart attack Mother 45  . Heart attack Father 46       heavy smoker    ROS: Some abdominal pain with eating relieved with Tums but no fevers or chills, productive cough, hemoptysis, dysphasia, odynophagia, melena, hematochezia, dysuria, hematuria, rash, seizure activity, orthopnea, PND, pedal edema, claudication. Remaining systems are negative.  Physical Exam: Well-developed well-nourished in no acute distress.  Skin is warm and dry.  HEENT is normal.  Neck is supple.  Chest is clear to auscultation with normal expansion.  Cardiovascular exam is regular rate and rhythm.  Abdominal exam nontender or distended. No masses palpated. Extremities show no edema. neuro grossly intact  ECG-sinus rhythm at a rate of 75, right bundle branch block.  Personally reviewed  A/P  1 disease status post coronary artery bypass graft-patient denies recurrent chest pain.  Continue medical therapy with aspirin.  She has declined statins previously.  2 hypertension-blood pressure controlled on no meds at present.  3 hyperlipidemia-she has declined statins previously and is not interested in other lipid-lowering medications.  Continue diet.  4 carotid artery disease-continue aspirin.  Kirk Ruths, MD

## 2020-02-26 ENCOUNTER — Other Ambulatory Visit: Payer: Self-pay

## 2020-02-26 ENCOUNTER — Ambulatory Visit (INDEPENDENT_AMBULATORY_CARE_PROVIDER_SITE_OTHER): Payer: Medicare Other | Admitting: Cardiology

## 2020-02-26 ENCOUNTER — Encounter: Payer: Self-pay | Admitting: Cardiology

## 2020-02-26 VITALS — BP 130/60 | HR 76 | Temp 98.3°F | Ht 61.0 in | Wt 147.0 lb

## 2020-02-26 DIAGNOSIS — I2510 Atherosclerotic heart disease of native coronary artery without angina pectoris: Secondary | ICD-10-CM | POA: Diagnosis not present

## 2020-02-26 DIAGNOSIS — I10 Essential (primary) hypertension: Secondary | ICD-10-CM

## 2020-02-26 DIAGNOSIS — E7800 Pure hypercholesterolemia, unspecified: Secondary | ICD-10-CM | POA: Diagnosis not present

## 2020-02-26 NOTE — Patient Instructions (Signed)

## 2020-07-02 ENCOUNTER — Emergency Department: Payer: Medicare Other

## 2020-07-02 ENCOUNTER — Emergency Department
Admission: EM | Admit: 2020-07-02 | Discharge: 2020-07-02 | Disposition: A | Discharge status: Home / Self Care | Payer: Medicare Other | Attending: Emergency Medicine | Admitting: Emergency Medicine

## 2020-07-02 ENCOUNTER — Other Ambulatory Visit: Payer: Self-pay

## 2020-07-02 DIAGNOSIS — Z7982 Long term (current) use of aspirin: Secondary | ICD-10-CM | POA: Diagnosis not present

## 2020-07-02 DIAGNOSIS — Y939 Activity, unspecified: Secondary | ICD-10-CM | POA: Diagnosis not present

## 2020-07-02 DIAGNOSIS — Y999 Unspecified external cause status: Secondary | ICD-10-CM | POA: Insufficient documentation

## 2020-07-02 DIAGNOSIS — S59901A Unspecified injury of right elbow, initial encounter: Secondary | ICD-10-CM | POA: Diagnosis present

## 2020-07-02 DIAGNOSIS — W010XXA Fall on same level from slipping, tripping and stumbling without subsequent striking against object, initial encounter: Secondary | ICD-10-CM | POA: Insufficient documentation

## 2020-07-02 DIAGNOSIS — Z955 Presence of coronary angioplasty implant and graft: Secondary | ICD-10-CM | POA: Diagnosis not present

## 2020-07-02 DIAGNOSIS — I2510 Atherosclerotic heart disease of native coronary artery without angina pectoris: Secondary | ICD-10-CM | POA: Diagnosis not present

## 2020-07-02 DIAGNOSIS — E039 Hypothyroidism, unspecified: Secondary | ICD-10-CM | POA: Diagnosis not present

## 2020-07-02 DIAGNOSIS — I10 Essential (primary) hypertension: Secondary | ICD-10-CM | POA: Diagnosis not present

## 2020-07-02 DIAGNOSIS — Z79899 Other long term (current) drug therapy: Secondary | ICD-10-CM | POA: Diagnosis not present

## 2020-07-02 DIAGNOSIS — Y9289 Other specified places as the place of occurrence of the external cause: Secondary | ICD-10-CM | POA: Insufficient documentation

## 2020-07-02 DIAGNOSIS — S53124A Posterior dislocation of right ulnohumeral joint, initial encounter: Secondary | ICD-10-CM | POA: Insufficient documentation

## 2020-07-02 DIAGNOSIS — W19XXXA Unspecified fall, initial encounter: Secondary | ICD-10-CM

## 2020-07-02 DIAGNOSIS — S53104A Unspecified dislocation of right ulnohumeral joint, initial encounter: Secondary | ICD-10-CM

## 2020-07-02 DIAGNOSIS — Z87891 Personal history of nicotine dependence: Secondary | ICD-10-CM | POA: Diagnosis not present

## 2020-07-02 MED ORDER — MORPHINE SULFATE (PF) 4 MG/ML IV SOLN
4.0000 mg | Freq: Once | INTRAVENOUS | Status: AC
  Administered 2020-07-02: 13:00:00 4 mg via INTRAVENOUS
  Filled 2020-07-02: qty 1

## 2020-07-02 MED ORDER — SODIUM CHLORIDE 0.9 % IV SOLN
INTRAVENOUS | Status: AC | PRN
  Administered 2020-07-02: 999 mL/h via INTRAVENOUS

## 2020-07-02 MED ORDER — ONDANSETRON HCL 4 MG/2ML IJ SOLN
4.0000 mg | Freq: Once | INTRAMUSCULAR | Status: AC
  Administered 2020-07-02: 13:00:00 4 mg via INTRAVENOUS
  Filled 2020-07-02: qty 2

## 2020-07-02 MED ORDER — PROPOFOL 10 MG/ML IV BOLUS
INTRAVENOUS | Status: AC | PRN
  Administered 2020-07-02: 16 mg via INTRAVENOUS

## 2020-07-02 MED ORDER — PROPOFOL 10 MG/ML IV BOLUS
INTRAVENOUS | Status: AC | PRN
  Administered 2020-07-02: 32 mg via INTRAVENOUS

## 2020-07-02 MED ORDER — PROPOFOL 10 MG/ML IV BOLUS
0.5000 mg/kg | Freq: Once | INTRAVENOUS | Status: AC
  Administered 2020-07-02: 16:00:00 32 mg via INTRAVENOUS
  Filled 2020-07-02: qty 20

## 2020-07-02 MED ORDER — FENTANYL CITRATE (PF) 100 MCG/2ML IJ SOLN
50.0000 ug | Freq: Once | INTRAMUSCULAR | Status: DC
  Filled 2020-07-02: qty 2

## 2020-07-02 NOTE — ED Notes (Signed)
Pt to XRAY

## 2020-07-02 NOTE — ED Triage Notes (Addendum)
Pt to ED via [facility] from [facility] for chief complaint of fall with right elbow pain, obvious deformity noted to right elbow. Right extremity elevated on pillow, states some numbness in fingers. Ems reports 20 G to left AC, 179mcg fentanyl given by EMS in 3 separate doses.  Reports falling d/t losing balance, denies LOC. VSS

## 2020-07-02 NOTE — Discharge Instructions (Addendum)
Please seek medical attention for any change in sensation to your arm, increased pain or swelling, discoloration or any other new or concerning symptoms.

## 2020-07-02 NOTE — ED Notes (Signed)
MD at bedside. 

## 2020-07-02 NOTE — ED Notes (Signed)
Pt transported to xray 

## 2020-07-02 NOTE — ED Provider Notes (Signed)
Assumed care from midlevel provider [place]. She had talked to Dr. Posey Pronto who though elbow should be able to be reduced in the ER. Discussed with patient risks and benefit of sedation to help achieve reduction.  .Sedation  Date/Time: 07/02/2020 4:00 PM Performed by: Nance Pear, MD Authorized by: Nance Pear, MD   Consent:    Consent obtained:  Verbal and written   Consent given by:  Patient   Risks discussed:  Allergic reaction, nausea, prolonged hypoxia resulting in organ damage, prolonged sedation necessitating reversal, respiratory compromise necessitating ventilatory assistance and intubation and vomiting   Alternatives discussed:  Analgesia without sedation Universal protocol:    Procedure explained and questions answered to patient or proxy's satisfaction: yes     Relevant documents present and verified: yes     Test results available and properly labeled: yes     Imaging studies available: yes     Required blood products, implants, devices, and special equipment available: yes     Site/side marked: yes     Immediately prior to procedure a time out was called: yes     Patient identity confirmation method:  Verbally with patient Indications:    Procedure performed:  Dislocation reduction   Procedure necessitating sedation performed by:  Physician performing sedation Pre-sedation assessment:    Time since last food or drink:  Unknown   NPO status caution: urgency dictates proceeding with non-ideal NPO status     ASA classification: class 2 - patient with mild systemic disease     Neck mobility: normal     Mouth opening:  3 or more finger widths   Thyromental distance:  4 finger widths   Mallampati score:  III - soft palate, base of uvula visible   Pre-sedation assessments completed and reviewed: airway patency, cardiovascular function, hydration status, mental status, nausea/vomiting, pain level, respiratory function and temperature   Immediate pre-procedure details:     Reassessment: Patient reassessed immediately prior to procedure     Reviewed: vital signs, relevant labs/tests and NPO status     Verified: bag valve mask available, emergency equipment available, intubation equipment available, IV patency confirmed, oxygen available and suction available   Procedure details (see MAR for exact dosages):    Preoxygenation:  Nasal cannula   Sedation:  Propofol   Intended level of sedation: deep   Analgesia:  Morphine   Intra-procedure monitoring:  Blood pressure monitoring, cardiac monitor, continuous pulse oximetry, frequent LOC assessments, frequent vital sign checks and continuous capnometry   Intra-procedure events: none     Intra-procedure events comment:  Brief episode of hypoxia which was reversed with coaching patient to take deep breaths   Total Provider sedation time (minutes):  15 Post-procedure details:    Attendance: Constant attendance by certified staff until patient recovered     Recovery: Patient returned to pre-procedure baseline     Post-sedation assessments completed and reviewed: airway patency, cardiovascular function, hydration status, mental status, nausea/vomiting, pain level, respiratory function and temperature     Patient is stable for discharge or admission: yes     Patient tolerance:  Tolerated well, no immediate complications   Reduction of dislocation Performed by: Nance Pear Authorized by: Nance Pear Consent: Verbal consent obtained. Risks and benefits: risks, benefits and alternatives were discussed Consent given by: patient Required items: required blood products, implants, devices, and special equipment available Time out: Immediately prior to procedure a "time out" was called to verify the correct patient, procedure, equipment, support staff and site/side marked as  required.  Patient sedated: propofol  Vitals: Vital signs were monitored during sedation. Patient tolerance: Patient tolerated the procedure  well with no immediate complications. Joint: right elbow Reduction technique: puller technique  Post reduction x-rays with good anatomical alignment. No fracture. Discussed this with patient. Arm was put in splint. Will discharge to follow up with orthopedics. Did offer pain medication prescription however patient declined.     Nance Pear, MD 07/02/20 [phone]

## 2020-07-02 NOTE — ED Provider Notes (Signed)
[facility] Emergency Department Provider Note ____________________________________________  Time seen: Approximately 3:16 PM  I have reviewed the triage vital signs and the nursing notes.   HISTORY  Chief Complaint Elbow Pain    HPI [name] is a 86 y.o. female who presents to the emergency department for evaluation and treatment of right elbow and shoulder pain after a mechanical, nonsyncopal fall.  She was reaching for the edge of a wall when her hand slipped and she fell forward.  She landed with her right hand outstretched and then to protect her head she laid her arm down.  No head strike.  No loss of consciousness.  She presents via EMS with her arm over her head splinted with pillows.  Obvious elbow dislocation.  Questionable shoulder dislocation.  She is awake, alert, and oriented.  Past Medical History:  Diagnosis Date  . Acute cholecystitis   . Acute renal failure (ARF) (HCC)    history of    denies  . Arthritis   . Cerebrovascular disease   . Chronic diarrhea   . Complication of anesthesia    "doesnt take a lot " medicine  . Coronary artery disease    CABG  x4 LIMA to LAD saphenous vein graft to diagonal, saphenous vein graft to circumflex marginal, saphenous vein graft to distal RAD -- Endoscopic vein harvesting of conduit  . Fuch's endothelial dystrophy   . Hyperlipidemia   . Hypertension   . Hypothyroidism   . Pancreatitis     Patient Active Problem List   Diagnosis Date Noted  . S/P lumbar laminectomy 02/14/2014  . PNA (pneumonia) 02/14/2014  . Psoriasis 02/07/2014  . CAD (coronary artery disease) 02/07/2014  . Spinal stenosis 01/31/2014  . Preop cardiovascular exam 12/22/2012  . HYPOTHYROIDISM 03/11/2009  . HYPERLIPIDEMIA 03/11/2009  . Essential hypertension 03/11/2009  . CORONARY ARTERY DISEASE 03/11/2009  . Cerebrovascular disease 03/11/2009  . CHEST PAIN 03/11/2009    Past Surgical History:  Procedure Laterality  Date  . APPENDECTOMY    . BACK SURGERY    . CARDIAC CATHETERIZATION  02/14/2007   Three-vessel coronary artery disease with small, diffusely diseased vessels -- normal LV function with EF of 55%  . CHOLECYSTECTOMY     13  . CHOLECYSTECTOMY, LAPAROSCOPIC  01/28/2010   Acute cholecystitis.  . CORONARY ARTERY BYPASS GRAFT  02/15/2007   x4 LIMA to LAD saphenous vein graft to diagonal, saphenous vein graft to circumflex marginal, saphenous vein graft to distal RAD -- Endoscopic vein harvesting of conduit  . ERCP W/ SPHINCTEROTOMY AND BALLOON DILATION  12/26/2007  . HEMORRHOID SURGERY  10/03/2007  . KNEE ARTHROSCOPY Right 6/14  . LUMBAR LAMINECTOMY/DECOMPRESSION MICRODISCECTOMY N/A 01/31/2014   Procedure: L3-5 DECOMPRESSION/REMOVAL OF FACET CYST;  Surgeon: Melina Schools, MD;  Location: [place];  Service: Orthopedics;  Laterality: N/A;    Prior to Admission medications   Medication Sig Start Date End Date Taking? Authorizing Provider  Adalimumab (HUMIRA [place]) 40 Units every 14 (fourteen) days.    [provider]  aspirin EC 81 MG tablet Take 81 mg by mouth daily.    [provider]  levothyroxine (SYNTHROID, LEVOTHROID) 50 MCG tablet Take 50 mcg by mouth daily.     [provider]  Multiple Vitamin (MULTIVITAMIN) tablet Take 1 tablet by mouth daily.    [provider]    Allergies Phenytoin sodium extended, Epinephrine, Nitroglycerin, Valium, and Hibiclens [chlorhexidine gluconate]  Family History  Problem Relation Age of Onset  .  Heart attack Mother 68  . Heart attack Father 69       heavy smoker    Social History Social History   Tobacco Use  . Smoking status: Former Smoker    Packs/day: 0.25    Years: 4.00    Pack years: 1.00    Types: Cigarettes    Quit date: 01/25/1953    Years since quitting: 67.4  . Smokeless tobacco: Never Used  Vaping Use  . Vaping Use: Never used  Substance Use Topics  . Alcohol use: No  . Drug use: No    Review  of Systems Constitutional: Negative for fever. Cardiovascular: Negative for chest pain. Respiratory: Negative for shortness of breath. Musculoskeletal: Positive for deformity to the right elbow.  Positive for contusion to the right elbow. Skin: Positive for ecchymosis and abrasion over the right elbow. Neurological: Negative for decrease in sensation  ____________________________________________   PHYSICAL EXAM:  VITAL SIGNS: ED Triage Vitals  Enc Vitals Group     BP 07/02/20 1328 (!) 157/71     Pulse Rate 07/02/20 1328 83     Resp 07/02/20 1328 16     Temp 07/02/20 1328 98.5 F (36.9 C)     Temp Source 07/02/20 1328 Oral     SpO2 07/02/20 1328 93 %     Weight 07/02/20 1317 141 lb (64 kg)     Height 07/02/20 1317 5\' 1"  (1.549 m)     Head Circumference --      Peak Flow --      Pain Score 07/02/20 1317 5     Pain Loc --      Pain Edu? --      Excl. in [place]? --     Constitutional: Alert and oriented. Well appearing and in no acute distress. Eyes: Conjunctivae are clear without discharge or drainage Head: Atraumatic Neck: Supple.  No focal midline tenderness. Respiratory: No cough. Respirations are even and unlabored. Musculoskeletal: Able to perform range of motion of the fingers of the right hand.  Able to perform range of motion of the wrist.  Unable to move right elbow.  Unwilling to move right shoulder. Neurologic: Motor and sensory function of the right upper extremity is intact. Skin: Superficial abrasion to the right elbow with early ecchymosis Psychiatric: Affect and behavior are appropriate.  ____________________________________________   LABS (all labs ordered are listed, but only abnormal results are displayed)  Labs Reviewed - No data to display ____________________________________________  RADIOLOGY  Right elbow shows dislocation with angulated fracture of forearm which is displaced posterior to the distal humerus.  I, Sherrie George, personally viewed  and evaluated these images (plain radiographs) as part of my medical decision making, as well as reviewing the written report by the radiologist.  DG Shoulder Right  Result Date: 07/02/2020 CLINICAL DATA:  Pain following fall EXAM: RIGHT SHOULDER - 1 VIEW COMPARISON:  None. FINDINGS: Axillary view obtained. On single view, there is no acute fracture or dislocation. There is generalized osteoarthritic change. No erosion. Visualized right lung clear. IMPRESSION: No fracture or dislocation on axillary view. Generalized osteoarthritic change noted. Electronically Signed   By: Lowella Grip III M.D.   On: 07/02/2020 14:12   DG Elbow 2 Views Right  Result Date: 07/02/2020 CLINICAL DATA:  Pain following fall EXAM: RIGHT ELBOW - 2 VIEW COMPARISON:  None. FINDINGS: Frontal and lateral views were obtained. There is dislocation at the elbow joint with the forearm displaced posterior to the distal humerus. There is angulation  at the fracture site. No acute fracture evident. No erosion. IMPRESSION: Elbow dislocation with the forearm displaced posterior to the distal humerus. There is angulation at fracture site as well. No fracture appreciable. Electronically Signed   By: Lowella Grip III M.D.   On: 07/02/2020 14:11   ____________________________________________   PROCEDURES  Procedures  ____________________________________________   INITIAL IMPRESSION / ASSESSMENT AND PLAN / ED COURSE  ALESANDRA SMART is a 86 y.o. who presents to the emergency department for treatment and evaluation after mechanical, nonsyncopal fall.  See HPI for further details.  Plan will be to get images of the right elbow and shoulder.  Will medicate prior to.  Differential diagnosis includes but is not limited to: Elbow dislocation, radius/ulnar fracture, shoulder dislocation, humerus fracture.  Pain controlled with morphine.  Zofran also given.  X-rays are consistent with an elbow dislocation with subsequent fracture.   Will discuss with orthopedics.  Case discussed with Dr. Posey Pronto who advises to reduce the fracture under conscious sedation then applied a long-arm splint.  He requested a complete shoulder image after sedation and able to rotate arm.  He will follow up with her in the office.  No CT of the elbow requested.  Patient will be moved to room 1.  Patient care relinquished to Dr. Archie Balboa who will proceed with reduction and splinting.   Medications  morphine 4 MG/ML injection 4 mg (4 mg Intravenous Given 07/02/20 1322)  ondansetron (ZOFRAN) injection 4 mg (4 mg Intravenous Given 07/02/20 1322)    Pertinent labs & imaging results that were available during my care of the patient were reviewed by me and considered in my medical decision making (see chart for details).   _________________________________________   FINAL CLINICAL IMPRESSION(S) / ED DIAGNOSES  Final diagnoses:  Dislocation of right elbow, initial encounter    ED Discharge Orders    None       If controlled substance prescribed during this visit, 12 month history viewed on the [place] prior to issuing an initial prescription for Schedule II or III opiod.   Victorino Dike, FNP 07/02/20 1526    Nance Pear, MD 07/02/20 1815

## 2021-10-13 ENCOUNTER — Telehealth: Payer: Self-pay

## 2021-10-13 NOTE — Telephone Encounter (Signed)
Pt had positive QFT.  Need to: complete Epi Order/send for chest x-ray Offer LTBI - waitlist or decline

## 2021-10-15 ENCOUNTER — Ambulatory Visit (LOCAL_COMMUNITY_HEALTH_CENTER): Payer: Self-pay

## 2021-10-15 DIAGNOSIS — R7612 Nonspecific reaction to cell mediated immunity measurement of gamma interferon antigen response without active tuberculosis: Secondary | ICD-10-CM | POA: Insufficient documentation

## 2021-10-15 NOTE — Telephone Encounter (Signed)
Phone call to [name], [phone].  Left message to please call RN with [facility] regarding TB testing completed for his mom.  Have some questions to go over, not sure if his mom needed to be on the call also. Please call [name] at [phone].

## 2021-10-15 NOTE — Progress Notes (Addendum)
The information obtained in the documents was obtained and reviewed during phone interview with patient's son.  Unsure of current weight, best guess is 125lbs  Been on Humira for about 3 years, has to get bloodwork every 6 months, thought she was getting this test every 6 months also.  Will be going to [facility] for chest x-ray tomorrow, 10/16/21.  Order placed.  Is interested in LTBI medication, has to take it before she can restart her Humira. Will check with PCP about Rifampin and INH options. May be interested in getting LTBI meds at [facility], unsure at this time.

## 2021-10-15 NOTE — Telephone Encounter (Signed)
Received phone call back.  Epi completed.  Going for chest x-ray 10/15/21. Order entered. Do want LTBI, may get it through PCP.  See TB document dated 10/15/21 for completed Epi.

## 2021-10-16 ENCOUNTER — Ambulatory Visit
Admission: RE | Admit: 2021-10-16 | Discharge: 2021-10-16 | Disposition: A | Discharge status: Home / Self Care | Payer: Medicare Other | Attending: Family Medicine | Admitting: Family Medicine

## 2021-10-16 ENCOUNTER — Other Ambulatory Visit: Payer: Self-pay | Admitting: Family Medicine

## 2021-10-16 ENCOUNTER — Ambulatory Visit
Admission: RE | Admit: 2021-10-16 | Discharge: 2021-10-16 | Disposition: A | Discharge status: Home / Self Care | Payer: Medicare Other | Source: Ambulatory Visit | Attending: Family Medicine | Admitting: Family Medicine

## 2021-10-16 DIAGNOSIS — R7612 Nonspecific reaction to cell mediated immunity measurement of gamma interferon antigen response without active tuberculosis: Secondary | ICD-10-CM | POA: Insufficient documentation

## 2021-10-16 NOTE — Progress Notes (Signed)
Ordered CXR

## 2021-10-20 ENCOUNTER — Telehealth: Payer: Self-pay

## 2021-10-20 NOTE — Telephone Encounter (Signed)
TC with patient's son.  Discussed side effects of INH.  Son reports that his mom has always had negative TB tests q6 months until now.  Strongly suggested that the patient be retested before starting INH. Patient has appt this Friday for testing. Will cancel LTBI appt                       if negative. Aileen Fass, RN

## 2021-10-20 NOTE — Telephone Encounter (Signed)
Returned phone call. Pt scheduled for LTBI tx appt, next appt is out two weeks, 11/03/21.  (Sent epic note to Dr. Ernestina Patches about confirmation and orders for INH and possibly bloodwork after reviewing pt's medications.)

## 2021-10-20 NOTE — Telephone Encounter (Signed)
Received phone call on work cell phone from patient's son Ronalee Belts.  Wants to know if we received chest x-ray results and when his mom can come in for LTBI.    Dr. Ernestina Patches has signed x-ray results and given instruction to proceed with LTBI.  Note to Dr. Ernestina Patches requesting confirmation of INH and any bloodwork for pt after review of her medications.

## 2021-10-20 NOTE — Telephone Encounter (Signed)
Look for orders And Look for any cancellations to get pt in prior to 11/03/21.

## 2021-10-23 ENCOUNTER — Ambulatory Visit (LOCAL_COMMUNITY_HEALTH_CENTER): Payer: Self-pay

## 2021-10-23 ENCOUNTER — Other Ambulatory Visit: Payer: Self-pay

## 2021-10-23 DIAGNOSIS — Z111 Encounter for screening for respiratory tuberculosis: Secondary | ICD-10-CM

## 2021-10-23 NOTE — Progress Notes (Signed)
Patient was referred to [facility] d/t a positive QFT. Patient's son explained earlier this week that she has always had negative TB tests/QFTs in the past, and her dermatologist tests her every 6 months. In for retesting today to rule out false positive on her last QFT. TB RN will call patient and son with results.  They should be back in a week.Aileen Fass, RN   +QFT 09/30/21 CXR without Active TB 10/16/21

## 2021-10-31 LAB — QUANTIFERON-TB GOLD PLUS
QuantiFERON Mitogen Value: >10 IU/mL
QuantiFERON Nil Value: 0.07 IU/mL
QuantiFERON TB1 Ag Value: 0.08 IU/mL
QuantiFERON TB2 Ag Value: 0.09 IU/mL
QuantiFERON-TB Gold Plus: NEGATIVE

## 2021-10-31 NOTE — Progress Notes (Signed)
TC re: negative results to patient's son.  Results will be faxed to [facility] (practice that referred patient for positive).  Explained that patient can go back on Humira now. Aileen Fass, RN

## 2021-11-02 NOTE — Telephone Encounter (Signed)
Repeat QFT Gold resulted in negative result. See 10/23/21 lab.  LTBI start appt was cancelled.

## 2021-11-02 NOTE — Telephone Encounter (Signed)
Repeat 10/23/21 QFT Gold test was negative.  Please see Sharyn Lull Dorminy's note on lab. Cancelled INH start appt for 11/03/21.

## 2022-09-07 ENCOUNTER — Ambulatory Visit: Payer: Medicare Other | Admitting: Nurse Practitioner

## 2022-09-07 DIAGNOSIS — H1013 Acute atopic conjunctivitis, bilateral: Secondary | ICD-10-CM

## 2022-09-07 DIAGNOSIS — L03213 Periorbital cellulitis: Secondary | ICD-10-CM

## 2022-09-07 MED ORDER — OLOPATADINE HCL 0.1 % OP SOLN: 1.0000 [drp] | Freq: Two times a day (BID) | OPHTHALMIC | 5 refills | Status: DC

## 2022-09-07 MED ORDER — DOXYCYCLINE HYCLATE 100 MG PO TABS: 100.0000 mg | ORAL_TABLET | Freq: Two times a day (BID) | ORAL | 0 refills | Status: DC

## 2022-09-07 NOTE — Progress Notes (Signed)
Careteam: Patient Care Team: Rusty Aus, MD as PCP - General (Internal Medicine)  Advanced Directive information    Allergies  Allergen Reactions   Phenytoin Sodium Extended     WHICH HAS CAUSED HER liver FAILURE   Epinephrine    Nitroglycerin     High sensitivity   Valium     High sensitivity   Hibiclens [Chlorhexidine Gluconate] Rash    Chief Complaint  Patient presents with   Acute Visit    Patient eye itchy, eyes very red. Patient states that symptoms started yesterday. Eyes hurting     HPI: Patient is a 88 y.o. female seen in today at the [facility] for itchy eye.  Reports there was no injury or occurrence but noticed her eyes were becoming increasingly itchy over the last few days and was worse yesterday. Started to rub eyes and now eyes and surrounding area is red. No pain  Reports she does have mild visual changes. Felt okay with driving locally and to [facility] today.  Started on left eye but now left and right both red and itchy.  No drainage noted.   She has a hx of macular degeneration and is seen routinely by Dr George Ina  Review of Systems:  Review of Systems  Constitutional:  Negative for chills, fever and weight loss.  Eyes:  Positive for redness. Negative for pain and discharge.  Skin:  Positive for itching and rash.    Past Medical History:  Diagnosis Date   Acute cholecystitis    Acute renal failure (ARF) (HCC)    history of    denies   Arthritis    Cerebrovascular disease    Chronic diarrhea    Complication of anesthesia    "doesnt take a lot " medicine   Coronary artery disease    CABG  x4 LIMA to LAD saphenous vein graft to diagonal, saphenous vein graft to circumflex marginal, saphenous vein graft to distal RAD -- Endoscopic vein harvesting of conduit   Fuch's endothelial dystrophy    Hyperlipidemia    Hypertension    Hypothyroidism    Macular degeneration disease    Pancreatitis    Past Surgical History:   Procedure Laterality Date   APPENDECTOMY     BACK SURGERY     CARDIAC CATHETERIZATION  02/14/2007   Three-vessel coronary artery disease with small, diffusely diseased vessels -- normal LV function with EF of 55%   CHOLECYSTECTOMY     13   CHOLECYSTECTOMY, LAPAROSCOPIC  01/28/2010   Acute cholecystitis.   CORONARY ARTERY BYPASS GRAFT  02/15/2007   x4 LIMA to LAD saphenous vein graft to diagonal, saphenous vein graft to circumflex marginal, saphenous vein graft to distal RAD -- Endoscopic vein harvesting of conduit   ERCP W/ SPHINCTEROTOMY AND BALLOON DILATION  12/26/2007   HEMORRHOID SURGERY  10/03/2007   KNEE ARTHROSCOPY Right 6/14   LUMBAR LAMINECTOMY/DECOMPRESSION MICRODISCECTOMY N/A 01/31/2014   Procedure: L3-5 DECOMPRESSION/REMOVAL OF FACET CYST;  Surgeon: Melina Schools, MD;  Location: [place];  Service: Orthopedics;  Laterality: N/A;   Social History:   reports that she quit smoking about 69 years ago. Her smoking use included cigarettes. She has a 1.00 pack-year smoking history. She has never used smokeless tobacco. She reports that she does not currently use alcohol. She reports that she does not use drugs.  Family History  Problem Relation Age of Onset   Heart attack Mother 30   Heart attack Father 24  heavy smoker    Medications: Patient's Medications  New Prescriptions   No medications on file  Previous Medications   ACETAMINOPHEN (TYLENOL) 325 MG CAPS    Take 1 tablet by mouth.   ADALIMUMAB (HUMIRA [place])    40 Units every 14 (fourteen) days.   ASPIRIN EC 81 MG TABLET    Take 81 mg by mouth daily.   BISACODYL (DULCOLAX) 5 MG EC TABLET    Take by mouth.   LEVOTHYROXINE (SYNTHROID, LEVOTHROID) 50 MCG TABLET    Take 50 mcg by mouth daily.    MYRBETRIQ 25 MG TB24 TABLET    Take 25 mg by mouth daily.   UNABLE TO FIND    Balance of Nature supplement  Modified Medications   No medications on file  Discontinued Medications   MULTIPLE VITAMIN (MULTIVITAMIN) TABLET     Take 1 tablet by mouth daily.    Physical Exam:  There were no vitals filed for this visit. There is no height or weight on file to calculate BMI. Wt Readings from Last 3 Encounters:  07/02/20 141 lb (64 kg)  02/26/20 147 lb (66.7 kg)  02/16/19 145 lb (65.8 kg)    Physical Exam HENT:     Nose: Nose normal.  Eyes:     Conjunctiva/sclera:     Right eye: Right conjunctiva is injected.     Left eye: Left conjunctiva is injected.     Pupils: Pupils are equal, round, and reactive to light.  Neurological:     Mental Status: She is alert.     Labs reviewed: Basic Metabolic Panel: No results for input(s): "NA", "K", "CL", "CO2", "GLUCOSE", "BUN", "CREATININE", "CALCIUM", "MG", "PHOS", "TSH" in the last 8760 hours. Liver Function Tests: No results for input(s): "AST", "ALT", "ALKPHOS", "BILITOT", "PROT", "ALBUMIN" in the last 8760 hours. No results for input(s): "LIPASE", "AMYLASE" in the last 8760 hours. No results for input(s): "AMMONIA" in the last 8760 hours. CBC: No results for input(s): "WBC", "NEUTROABS", "HGB", "HCT", "MCV", "PLT" in the last 8760 hours. Lipid Panel: No results for input(s): "CHOL", "HDL", "LDLCALC", "TRIG", "CHOLHDL", "LDLDIRECT" in the last 8760 hours. TSH: No results for input(s): "TSH" in the last 8760 hours. A1C: No results found for: "HGBA1C"   Assessment/Plan 1. Periorbital cellulitis of left eye Redness surrounding eyes bilaterally - doxycycline (VIBRA-TABS) 100 MG tablet; Take 1 tablet (100 mg total) by mouth 2 (two) times daily.  Dispense: 14 tablet; Refill: 0 -to notify if heat, drainage, fever or worsening of redness occurs.   2. Allergic conjunctivitis of both eyes -severe itching to bilateral eyes with some change in vision -appt set to see ophthalmologist tomorrow - olopatadine (PATANOL) 0.1 % ophthalmic solution; Place 1 drop into both eyes 2 (two) times daily.  Dispense: 5 mL; Refill: 5 Can use cool compresses to help with itching  which she did last night with improvement.   To follow up with nurse in 1 week, sooner if worsens. Carlos American. [place], [facility] [phone]

## 2022-09-07 NOTE — Patient Instructions (Addendum)
   To start doxycycline 100 mg by mouth twice daily for 1 week.   To use Patanol eye drop 1 drop to both eyes twice daily  To use cool compresses to eyes as needed   You have appt at 2:25 with Dr George Ina at [facility]

## 2023-04-28 ENCOUNTER — Encounter: Payer: Self-pay | Admitting: Podiatry

## 2023-04-28 ENCOUNTER — Ambulatory Visit (INDEPENDENT_AMBULATORY_CARE_PROVIDER_SITE_OTHER): Payer: Medicare Other | Admitting: Podiatry

## 2023-04-28 ENCOUNTER — Ambulatory Visit (INDEPENDENT_AMBULATORY_CARE_PROVIDER_SITE_OTHER): Payer: Medicare Other

## 2023-04-28 ENCOUNTER — Ambulatory Visit: Payer: Medicare Other | Admitting: Podiatry

## 2023-04-28 DIAGNOSIS — S99922A Unspecified injury of left foot, initial encounter: Secondary | ICD-10-CM | POA: Diagnosis not present

## 2023-04-28 DIAGNOSIS — S92415A Nondisplaced fracture of proximal phalanx of left great toe, initial encounter for closed fracture: Secondary | ICD-10-CM | POA: Diagnosis not present

## 2023-04-28 NOTE — Progress Notes (Signed)
Subjective:   Patient ID: [name], female   DOB: 89 y.o.   MRN: [id number]   HPI Patient presents stating she traumatized her big toe left foot and it has been sore for 3 weeks and she thinks maybe she broke something.  Also gets chronic pain plantar aspect right foot that she uses padding for and patient is well oriented does not smoke and tries to be active   Review of Systems  All other systems reviewed and are negative.       Objective:  Physical Exam Vitals and nursing note reviewed.  Constitutional:      Appearance: She is well-developed.  Pulmonary:     Effort: Pulmonary effort is normal.  Musculoskeletal:        General: Normal range of motion.  Skin:    General: Skin is warm.  Neurological:     Mental Status: She is alert.     Neurovascular status mildly diminished but intact with good flow for her age with range of motion adequate and muscle strength adequate.  Patient is noted to have edema of the left big toe and the proximal to phalangeal joint and proximal to this and has what appears to be plantarflexion fourth metatarsal right with lesion formation.  Good digital perfusion well-oriented x 3     Assessment:  Possibility for fracture of the left hallux proximal phalanx with plantarflexed metatarsal lesion formation right     Plan:  H&P x-rays left reviewed discussed fracture toe and continued immobilization and for the right discussed possible orthotics or other treatments but will continue to use pads currently.  Patient will be seen back as symptoms indicate but may require another 6 to 8 weeks for complete healing  X-rays left indicated there is a fracture of the mid proximal phalanx not involving joint surface

## 2023-04-29 ENCOUNTER — Other Ambulatory Visit: Payer: Self-pay | Admitting: Podiatry

## 2023-04-29 DIAGNOSIS — S99922A Unspecified injury of left foot, initial encounter: Secondary | ICD-10-CM

## 2023-04-29 DIAGNOSIS — S92415A Nondisplaced fracture of proximal phalanx of left great toe, initial encounter for closed fracture: Secondary | ICD-10-CM

## 2023-09-29 ENCOUNTER — Other Ambulatory Visit: Payer: Self-pay

## 2023-09-29 ENCOUNTER — Emergency Department (HOSPITAL_COMMUNITY): Payer: Medicare Other

## 2023-09-29 ENCOUNTER — Encounter (HOSPITAL_COMMUNITY): Payer: Self-pay

## 2023-09-29 ENCOUNTER — Emergency Department (HOSPITAL_COMMUNITY)
Admission: EM | Admit: 2023-09-29 | Discharge: 2023-09-29 | Disposition: A | Discharge status: Home / Self Care | Payer: Medicare Other | Attending: Emergency Medicine | Admitting: Emergency Medicine

## 2023-09-29 DIAGNOSIS — Z23 Encounter for immunization: Secondary | ICD-10-CM | POA: Diagnosis not present

## 2023-09-29 DIAGNOSIS — Z951 Presence of aortocoronary bypass graft: Secondary | ICD-10-CM | POA: Insufficient documentation

## 2023-09-29 DIAGNOSIS — S61412A Laceration without foreign body of left hand, initial encounter: Secondary | ICD-10-CM | POA: Diagnosis not present

## 2023-09-29 DIAGNOSIS — S0990XA Unspecified injury of head, initial encounter: Secondary | ICD-10-CM | POA: Diagnosis present

## 2023-09-29 DIAGNOSIS — I2510 Atherosclerotic heart disease of native coronary artery without angina pectoris: Secondary | ICD-10-CM | POA: Insufficient documentation

## 2023-09-29 DIAGNOSIS — S0003XA Contusion of scalp, initial encounter: Secondary | ICD-10-CM | POA: Diagnosis not present

## 2023-09-29 DIAGNOSIS — E039 Hypothyroidism, unspecified: Secondary | ICD-10-CM | POA: Insufficient documentation

## 2023-09-29 DIAGNOSIS — Y92481 Parking lot as the place of occurrence of the external cause: Secondary | ICD-10-CM | POA: Diagnosis not present

## 2023-09-29 DIAGNOSIS — W010XXA Fall on same level from slipping, tripping and stumbling without subsequent striking against object, initial encounter: Secondary | ICD-10-CM | POA: Diagnosis not present

## 2023-09-29 DIAGNOSIS — Y9301 Activity, walking, marching and hiking: Secondary | ICD-10-CM | POA: Diagnosis not present

## 2023-09-29 DIAGNOSIS — W19XXXA Unspecified fall, initial encounter: Secondary | ICD-10-CM

## 2023-09-29 MED ORDER — ACETAMINOPHEN 500 MG PO TABS
1000.0000 mg | ORAL_TABLET | Freq: Once | ORAL | Status: AC
  Administered 2023-09-29: 1000 mg via ORAL
  Filled 2023-09-29: qty 2

## 2023-09-29 MED ORDER — LIDOCAINE HCL (PF) 1 % IJ SOLN
20.0000 mg | Freq: Once | INTRAMUSCULAR | Status: AC
  Administered 2023-09-29: 20 mg
  Filled 2023-09-29: qty 5

## 2023-09-29 MED ORDER — TETANUS-DIPHTH-ACELL PERTUSSIS 5-2.5-18.5 LF-MCG/0.5 IM SUSY
0.5000 mL | PREFILLED_SYRINGE | Freq: Once | INTRAMUSCULAR | Status: AC
  Administered 2023-09-29: 0.5 mL via INTRAMUSCULAR
  Filled 2023-09-29: qty 0.5

## 2023-09-29 NOTE — Discharge Instructions (Addendum)
You were seen in the ED today after a fall. Your CT scans and x-ray did not show any broken bones or bleeding. You may take tylenol for pain and apply an ice pack to your scalp to help reduce swelling.   Your stitches in the left hand will need to be removed in 7 days. Keep this area clean and dry.
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
## 2024-04-18 ENCOUNTER — Ambulatory Visit (INDEPENDENT_AMBULATORY_CARE_PROVIDER_SITE_OTHER): Admitting: Podiatry

## 2024-04-18 ENCOUNTER — Encounter: Payer: Self-pay | Admitting: Podiatry

## 2024-04-18 VITALS — Ht 61.0 in | Wt 141.0 lb

## 2024-04-18 DIAGNOSIS — D2371 Other benign neoplasm of skin of right lower limb, including hip: Secondary | ICD-10-CM | POA: Diagnosis not present

## 2024-04-18 NOTE — Progress Notes (Signed)
  Subjective:  Patient ID: [name], female    DOB: 1929/01/14,  MRN: [id number]  Chief Complaint  Patient presents with   Plantar Warts    Right foot plantars wart    89 y.o. female presents with the above complaint. History confirmed with patient.  She is a painful spot right foot that has been there before it was trimmed out many years ago thought it might been a plantar wart  Objective:  Physical Exam: warm, good capillary refill, no trophic changes or ulcerative lesions, normal DP and PT pulses, normal sensory exam, and painful benign appearing skin lesion right submetatarsal 4 Assessment:   1. Benign neoplasm of skin of right lower extremity      Plan:  Patient was evaluated and treated and all questions answered.  Lesion appeared to be consistent with a porokeratosis.  The lesion was debrided sharply with a scalpel and enucleated the central core and applied salinocaine ointment.  Discussed risk of recurrence and offloading with aperture pads that they will continue to use.  Return as needed.  Return if symptoms worsen or fail to improve.

## 2024-10-16 ENCOUNTER — Non-Acute Institutional Stay: Payer: Self-pay | Admitting: Internal Medicine

## 2024-10-16 ENCOUNTER — Encounter: Payer: Self-pay | Admitting: Internal Medicine

## 2024-10-16 DIAGNOSIS — W19XXXA Unspecified fall, initial encounter: Secondary | ICD-10-CM | POA: Diagnosis not present

## 2024-10-16 DIAGNOSIS — S80811A Abrasion, right lower leg, initial encounter: Secondary | ICD-10-CM

## 2024-10-16 NOTE — Assessment & Plan Note (Addendum)
 Minor skin abrasion. Local wound care.

## 2024-10-16 NOTE — Assessment & Plan Note (Addendum)
 Pt able to stand. No apparent fracture of right index finger.

## 2024-10-16 NOTE — Progress Notes (Signed)
 [facility] Acute Care Progress Note    Location:  Other [place].  Nursing Home Room Number: [facility] [id number] Place of Service:  ALF [phone]) Camellia Door, DO   PCP: Cleotilde Oneil FALCON, MD   Patient Care Team: Cleotilde Oneil FALCON, MD as PCP - General (Internal Medicine)   Extended Emergency Contact Information Primary Emergency Contact: Yon,Mike Address: [address]          [place], [place] Home Phone: [phone] Mobile Phone: [phone] Relation: Son Secondary Emergency Contact: Vannie Inocente Sales, [place] Phone: [phone] Relation: Daughter   Goals of care: Advanced Directive information    09/29/2023   10:59 AM  Advanced Directives  Does Patient Have a Medical Advance Directive? No  Would patient like information on creating a medical advance directive? No - Patient declined     CODE STATUS:Full Code.    Chief Complaint  Patient presents with   Fall    Fall, Right Index Finger Pain.      HPI: Pt is a 89 y.o. female seen today for an acute visit for Fall, Right Index Finger Pain. Pt fell today in bathroom. Hurt her right index finger and suffered abrasion to right lower leg. No head trauma.     Past Medical History:  Diagnosis Date   Acute cholecystitis    Acute renal failure (ARF)    history of    denies   Arthritis    Cerebrovascular disease    Chronic diarrhea    Complication of anesthesia    doesnt take a lot  medicine   Coronary artery disease    CABG  x4 LIMA to LAD saphenous vein graft to diagonal, saphenous vein graft to circumflex marginal, saphenous vein graft to distal RAD -- Endoscopic vein harvesting of conduit   Fuch's endothelial dystrophy    Hyperlipidemia    Hypertension    Hypothyroidism    Macular degeneration disease    Pancreatitis    Past Surgical History:  Procedure Laterality Date   APPENDECTOMY     BACK SURGERY     CARDIAC CATHETERIZATION   02/14/2007   Three-vessel coronary artery disease with small, diffusely diseased vessels -- normal LV function with EF of 55%   CHOLECYSTECTOMY     13   CHOLECYSTECTOMY, LAPAROSCOPIC  01/28/2010   Acute cholecystitis.   CORONARY ARTERY BYPASS GRAFT  02/15/2007   x4 LIMA to LAD saphenous vein graft to diagonal, saphenous vein graft to circumflex marginal, saphenous vein graft to distal RAD -- Endoscopic vein harvesting of conduit   ERCP W/ SPHINCTEROTOMY AND BALLOON DILATION  12/26/2007   HEMORRHOID SURGERY  10/03/2007   KNEE ARTHROSCOPY Right 6/14   LUMBAR LAMINECTOMY/DECOMPRESSION MICRODISCECTOMY N/A 01/31/2014   Procedure: L3-5 DECOMPRESSION/REMOVAL OF FACET CYST;  Surgeon: Donaciano Sprang, MD;  Location: [facility] OR;  Service: Orthopedics;  Laterality: N/A;     Allergies  Allergen Reactions   Phenytoin Sodium Extended     WHICH HAS CAUSED HER liver FAILURE   Epinephrine     Nitroglycerin     High sensitivity   Valium     High sensitivity   Hibiclens [Chlorhexidine Gluconate] Rash     Outpatient Encounter Medications as of 10/16/2024  Medication Sig   acetaminophen  (TYLENOL ) 500 MG tablet Take 500 mg by mouth every 8 (eight) hours as needed. Give one capsule by mouth at bedtime.   Adalimumab (HUMIRA Alpine)  40 Units every 14 (fourteen) days.   Apoaequorin (PREVAGEN EXTRA STRENGTH PO) Take 1 capsule by mouth daily.   hydroxypropyl methylcellulose / hypromellose (ISOPTO TEARS / GONIOVISC) 2.5 % ophthalmic solution Place 1 drop into both eyes every 6 (six) hours as needed for dry eyes.   levothyroxine  (SYNTHROID , LEVOTHROID) 50 MCG tablet Take 50 mcg by mouth daily.    Multiple Vitamins-Minerals (PRESERVISION AREDS 2) CAPS Take 1 capsule by mouth 2 (two) times daily.   Acetaminophen  (TYLENOL ) 325 MG CAPS Take 1 tablet by mouth. (Patient not taking: Reported on 10/16/2024)   aspirin EC 81 MG tablet Take 81 mg by mouth daily. (Patient not taking: Reported on 10/16/2024)   bisacodyl (DULCOLAX) 5  MG EC tablet Take by mouth. (Patient not taking: Reported on 10/16/2024)   doxycycline  (VIBRA -TABS) 100 MG tablet Take 1 tablet (100 mg total) by mouth 2 (two) times daily. (Patient not taking: Reported on 10/16/2024)   MYRBETRIQ 25 MG TB24 tablet Take 25 mg by mouth daily. (Patient not taking: Reported on 10/16/2024)   olopatadine  (PATANOL) 0.1 % ophthalmic solution Place 1 drop into both eyes 2 (two) times daily. (Patient not taking: Reported on 10/16/2024)   UNABLE TO FIND Balance of Nature supplement (Patient not taking: Reported on 10/16/2024)   No facility-administered encounter medications on file as of 10/16/2024.     Review of Systems  Musculoskeletal:        Right index finger pain  Skin:        Abrasion right lower leg  All other systems reviewed and are negative.    Immunization History  Administered Date(s) Administered   INFLUENZA, HIGH DOSE SEASONAL PF 09/16/2017   Influenza-Unspecified 10/02/2024   Moderna Sars-Covid-2 Vaccination 01/21/2020, 02/11/2020   PPD Test 02/03/2013   Tdap 09/29/2023   Zoster Recombinant(Shingrix) 11/09/2017   Pertinent  Health Maintenance Due  Topic Date Due   DEXA SCAN  Never done   Influenza Vaccine  Completed      02/15/2019   12:15 PM 07/02/2020    1:26 PM  Fall Risk  (RETIRED) Patient Fall Risk Level Low fall risk  High fall risk      Data saved with a previous flowsheet row definition   Functional Status Survey:     Vitals:   10/16/24 0804  BP: 112/73  Pulse: 85  Resp: 15  Temp: 98.8 F (37.1 C)  SpO2: 96%  Weight: 126 lb (57.2 kg)  Height: 5' 1 (1.549 m)   Body mass index is 23.81 kg/m. Physical Exam Vitals and nursing note reviewed.  HENT:     Head: Normocephalic.  Cardiovascular:     Rate and Rhythm: Normal rate and regular rhythm.  Musculoskeletal:     Comments: Able to flex right index finger at both PIP and DPI joints. Minor bruising. OA changes of DIP and PIP. See picture  Skin:    General: Skin is  warm.     Capillary Refill: Capillary refill takes less than 2 seconds.     Comments: Superficial Abrasion right shin  Neurological:     Mental Status: She is alert.              Lab Results  Component Value Date   TSH 1.55 02/19/2009   No results found for: HGBA1C Lab Results  Component Value Date   CHOL 148 02/19/2009   HDL 34.0 (L) 02/19/2009   LDLCALC (H) 09/23/2007    144        Total Cholesterol/HDL:CHD  Risk Coronary Heart Disease Risk Table                     Men   Women  1/2 Average Risk   3.4   3.3   LDLDIRECT 74.1 02/19/2009   TRIG 279 (HH) 02/19/2009   CHOLHDL 4.4 CALC 02/19/2009     Assessment & Plan Fall, initial encounter Pt able to stand. No apparent fracture of right index finger.     Abrasion of right lower leg, initial encounter Minor skin abrasion. Local wound care.        Camellia Door, DO  [facility] [phone]

## 2024-10-17 ENCOUNTER — Encounter: Payer: Self-pay | Admitting: Internal Medicine

## 2024-10-17 NOTE — Progress Notes (Signed)
 Error-Patient not in building to be seen This encounter was created in error - please disregard.

## 2024-10-18 ENCOUNTER — Non-Acute Institutional Stay: Payer: Self-pay | Admitting: Internal Medicine

## 2024-10-18 ENCOUNTER — Encounter: Payer: Self-pay | Admitting: Internal Medicine

## 2024-10-18 DIAGNOSIS — E785 Hyperlipidemia, unspecified: Secondary | ICD-10-CM

## 2024-10-18 DIAGNOSIS — L4050 Arthropathic psoriasis, unspecified: Secondary | ICD-10-CM

## 2024-10-18 DIAGNOSIS — I10 Essential (primary) hypertension: Secondary | ICD-10-CM

## 2024-10-18 DIAGNOSIS — G301 Alzheimer's disease with late onset: Secondary | ICD-10-CM

## 2024-10-18 DIAGNOSIS — F0280 Dementia in other diseases classified elsewhere without behavioral disturbance: Secondary | ICD-10-CM

## 2024-10-18 DIAGNOSIS — E039 Hypothyroidism, unspecified: Secondary | ICD-10-CM | POA: Diagnosis not present

## 2024-10-18 DIAGNOSIS — S80811D Abrasion, right lower leg, subsequent encounter: Secondary | ICD-10-CM | POA: Diagnosis not present

## 2024-10-18 NOTE — Progress Notes (Signed)
 [facility] Routine Visit Progress Note    Location:  Other [facility] Room Number: [facility] [id number] Place of Service:  ALF [phone]) Camellia Door, DO   PCP: Cleotilde Oneil FALCON, MD   Patient Care Team: Cleotilde Oneil FALCON, MD as PCP - General (Internal Medicine)   Extended Emergency Contact Information Primary Emergency Contact: Diep,Mike Address: [address]          [place], [place] Home Phone: [phone] Mobile Phone: [phone] Relation: Son Secondary Emergency Contact: Vannie Inocente Sales, [place] Phone: [phone] Relation: Daughter   Goals of care: Advanced Directive information    10/17/2024    1:06 PM  Advanced Directives  Does Patient Have a Medical Advance Directive? No  Would patient like information on creating a medical advance directive? No - Patient declined    CODE STATUS: Full Code   Chief Complaint  Patient presents with   Medical Management of Chronic Issues    Medical Management of Chronic Issues.      HPI: Pt is a 89 y.o. female seen today for medical management of chronic disease.  Ginne is a 89 year old female with a history of hypothyroidism and plaque psoriasis who is very healthy.  She just got back from going out with her son yesterday to a casino.  She tells me that she $450 while playing a slot machine.  Her son also won some money.  She is going out to lunch with the independent living nurse today.  He is still press photographer.  She walks with a walker.  She had a fall last week when I saw her.  She injured her right index finger but did not break it.  She has a small abrasion on her right shin that is healing.  She has a bruise on her left shoulder.  She has a bruise over her left eyebrow that is healing.  She has other no other complaints about her health.  She states that she is feeling pretty good for 89 years old.  She has no issues with her bowel or bladder.  She  does wear adult briefs.  She has some mild issues with urinary incontinence but is still able to toilet on her own.  Patient has only been in memory care since September 2025.  Previously she had been living in independent living at [facility].     Past Medical History:  Diagnosis Date   Acute cholecystitis    Acute renal failure (ARF)    history of    denies   Arthritis    Cerebrovascular disease    Chronic diarrhea    Complication of anesthesia    doesnt take a lot  medicine   Coronary artery disease    CABG  x4 LIMA to LAD saphenous vein graft to diagonal, saphenous vein graft to circumflex marginal, saphenous vein graft to distal RAD -- Endoscopic vein harvesting of conduit   Fuch's endothelial dystrophy    Hyperlipidemia    Hypertension    Hypothyroidism    Macular degeneration disease    Pancreatitis    Past Surgical History:  Procedure Laterality Date   APPENDECTOMY     BACK SURGERY     CARDIAC CATHETERIZATION  02/14/2007   Three-vessel coronary artery disease with small, diffusely diseased vessels -- normal LV function with EF of 55%   CHOLECYSTECTOMY     13  CHOLECYSTECTOMY, LAPAROSCOPIC  01/28/2010   Acute cholecystitis.   CORONARY ARTERY BYPASS GRAFT  02/15/2007   x4 LIMA to LAD saphenous vein graft to diagonal, saphenous vein graft to circumflex marginal, saphenous vein graft to distal RAD -- Endoscopic vein harvesting of conduit   ERCP W/ SPHINCTEROTOMY AND BALLOON DILATION  12/26/2007   HEMORRHOID SURGERY  10/03/2007   KNEE ARTHROSCOPY Right 6/14   LUMBAR LAMINECTOMY/DECOMPRESSION MICRODISCECTOMY N/A 01/31/2014   Procedure: L3-5 DECOMPRESSION/REMOVAL OF FACET CYST;  Surgeon: Donaciano Sprang, MD;  Location: [facility] OR;  Service: Orthopedics;  Laterality: N/A;     Allergies  Allergen Reactions   Phenytoin Sodium Extended     WHICH HAS CAUSED HER liver FAILURE   Epinephrine     Nitroglycerin     High sensitivity   Valium     High sensitivity   Hibiclens  [Chlorhexidine Gluconate] Rash     Outpatient Encounter Medications as of 10/18/2024  Medication Sig   acetaminophen  (TYLENOL ) 500 MG tablet Take 500 mg by mouth every 8 (eight) hours as needed. Give one capsule by mouth at bedtime.   Adalimumab (HUMIRA [place]) 40 Units every 14 (fourteen) days.   Apoaequorin (PREVAGEN EXTRA STRENGTH PO) Take 1 capsule by mouth daily.   hydroxypropyl methylcellulose / hypromellose (ISOPTO TEARS / GONIOVISC) 2.5 % ophthalmic solution Place 1 drop into both eyes every 6 (six) hours as needed for dry eyes.   levothyroxine  (SYNTHROID , LEVOTHROID) 50 MCG tablet Take 50 mcg by mouth daily.    Multiple Vitamins-Minerals (PRESERVISION AREDS 2) CAPS Take 1 capsule by mouth 2 (two) times daily.   Acetaminophen  (TYLENOL ) 325 MG CAPS Take 1 tablet by mouth. (Patient not taking: Reported on 10/18/2024)   aspirin EC 81 MG tablet Take 81 mg by mouth daily. (Patient not taking: Reported on 10/18/2024)   bisacodyl (DULCOLAX) 5 MG EC tablet Take by mouth. (Patient not taking: Reported on 10/18/2024)   doxycycline  (VIBRA -TABS) 100 MG tablet Take 1 tablet (100 mg total) by mouth 2 (two) times daily. (Patient not taking: Reported on 10/18/2024)   MYRBETRIQ 25 MG TB24 tablet Take 25 mg by mouth daily. (Patient not taking: Reported on 10/18/2024)   olopatadine  (PATANOL) 0.1 % ophthalmic solution Place 1 drop into both eyes 2 (two) times daily. (Patient not taking: Reported on 10/18/2024)   UNABLE TO FIND Balance of [place] supplement (Patient not taking: Reported on 10/18/2024)   No facility-administered encounter medications on file as of 10/18/2024.     Review of Systems  Constitutional: Negative.   HENT: Negative.    Eyes: Negative.   Respiratory: Negative.    Cardiovascular: Negative.   Gastrointestinal: Negative.   Endocrine: Negative.   Genitourinary:        Occ urinary incontinence  Musculoskeletal: Negative.   Skin:        Minor abrasion to right shin and bruise to  left shoulder  Allergic/Immunologic: Negative.   Neurological: Negative.   Hematological: Negative.   Psychiatric/Behavioral: Negative.    All other systems reviewed and are negative.     Immunization History  Administered Date(s) Administered   INFLUENZA, HIGH DOSE SEASONAL PF 09/16/2017   Influenza-Unspecified 10/02/2024   Moderna Sars-Covid-2 Vaccination 01/21/2020, 02/11/2020   PPD Test 02/03/2013   Tdap 09/29/2023   Zoster Recombinant(Shingrix) 11/09/2017   Pertinent  Health Maintenance Due  Topic Date Due   DEXA SCAN  Never done   Influenza Vaccine  Completed      02/15/2019   12:15 PM 07/02/2020    1:26  PM  Fall Risk  (RETIRED) Patient Fall Risk Level Low fall risk  High fall risk      Data saved with a previous flowsheet row definition   Functional Status Survey:     Vitals:   10/18/24 0948  BP: (!) 148/62  Pulse: 78  Resp: 13  Temp: 98.2 F (36.8 C)  SpO2: 96%  Weight: 126 lb (57.2 kg)  Height: 5' 1 (1.549 m)   Body mass index is 23.81 kg/m. Physical Exam Vitals and nursing note reviewed.  Constitutional:      General: She is not in acute distress.    Appearance: She is normal weight. She is not ill-appearing or diaphoretic.     Comments: Appears younger than stated age of 71. Axox4.  HENT:     Head: Normocephalic and atraumatic.     Nose: Nose normal.  Eyes:     General: No scleral icterus. Cardiovascular:     Rate and Rhythm: Normal rate and regular rhythm.  Pulmonary:     Effort: Pulmonary effort is normal.     Breath sounds: Normal breath sounds.  Abdominal:     General: Bowel sounds are normal. There is no distension.     Palpations: Abdomen is soft.     Tenderness: There is no abdominal tenderness.  Skin:    Capillary Refill: Capillary refill takes less than 2 seconds.     Findings: Bruising present.     Comments: Over left eyebrow. Bruising of left shoulder. Healing abrasion right shin  Neurological:     General: No focal deficit  present.     Mental Status: She is alert and oriented to person, place, and time.         Labs reviewed:  Lab Results  Component Value Date   TSH 1.55 02/19/2009   No results found for: HGBA1C Lab Results  Component Value Date   CHOL 148 02/19/2009   HDL 34.0 (L) 02/19/2009   LDLCALC (H) 09/23/2007    144        Total Cholesterol/HDL:CHD Risk Coronary Heart Disease Risk Table                     Men   Women  1/2 Average Risk   3.4   3.3   LDLDIRECT 74.1 02/19/2009   TRIG 279 (HH) 02/19/2009   CHOLHDL 4.4 CALC 02/19/2009   Assessment/Plan Assessment & Plan Abrasion of right lower leg, subsequent encounter Still with a mild abrasion to her right shin.  Healing nicely.  No evidence of infection.         Psoriatic arthritis (HCC) Stable.  Remains on subcu Humira every 2 weeks.     Hyperlipidemia, unspecified hyperlipidemia type Currently not on any treatment.  She declined to be on any form of statin when she was last seen by cardiology in March 2021.  Given her age of 60, I do not see any benefit of starting her on the cholesterol lowering medications.     Acquired hypothyroidism Stable on 50 mcg of Synthroid  daily.  Last TSH in July 2025 at 3.125.  This is normal.     Alzheimer's type dementia with late onset without behavioral disturbance (HCC) Very mild.  Her memory loss is only intermittent.  She remembers that she went out yesterday with her son to go gambling.  She still alert and oriented x 4 at this point.     Essential hypertension Patient currently not on any antihypertensives.  She  had been previously on Toprol  XL 25 mg daily.  However when she was admitted to [facility] memory care, Toprol -XL was not on her medication list.  During her last visit with her private PCP in July 2025, she was not on any antihypertensives during that visit.  Blood pressure seems to be well-controlled with systolic blood pressures ranging 114 to a maximum of 151.   Diastolic blood pressures of 60 to to 79.  Heart rates 71-98 beats a minute.  Continue to monitor off of any antihypertensives for now.        Camellia Door, DO  [facility] [phone]

## 2024-10-18 NOTE — Assessment & Plan Note (Addendum)
 Still with a mild abrasion to her right shin.  Healing nicely.  No evidence of infection.

## 2024-10-18 NOTE — Assessment & Plan Note (Addendum)
 Patient currently not on any antihypertensives.  She had been previously on Toprol  XL 25 mg daily.  However when she was admitted to [facility] memory care, Toprol -XL was not on her medication list.  During her last visit with her private PCP in July 2025, she was not on any antihypertensives during that visit.  Blood pressure seems to be well-controlled with systolic blood pressures ranging 114 to a maximum of 151.  Diastolic blood pressures of 60 to to 79.  Heart rates 71-98 beats a minute.  Continue to monitor off of any antihypertensives for now.

## 2024-10-18 NOTE — Assessment & Plan Note (Addendum)
 Stable on 50 mcg of Synthroid  daily.  Last TSH in July 2025 at 3.125.  This is normal.

## 2024-10-18 NOTE — Assessment & Plan Note (Addendum)
 Currently not on any treatment.  She declined to be on any form of statin when she was last seen by cardiology in March 2021.  Given her age of 17, I do not see any benefit of starting her on the cholesterol lowering medications.

## 2024-10-18 NOTE — Assessment & Plan Note (Addendum)
 Very mild.  Her memory loss is only intermittent.  She remembers that she went out yesterday with her son to go gambling.  She still alert and oriented x 4 at this point.

## 2024-10-18 NOTE — Assessment & Plan Note (Addendum)
 Stable.  Remains on subcu Humira every 2 weeks.

## 2024-12-10 ENCOUNTER — Encounter: Payer: Self-pay | Admitting: Orthopedic Surgery

## 2024-12-10 ENCOUNTER — Non-Acute Institutional Stay: Payer: Self-pay | Admitting: Orthopedic Surgery

## 2024-12-10 DIAGNOSIS — I2510 Atherosclerotic heart disease of native coronary artery without angina pectoris: Secondary | ICD-10-CM

## 2024-12-10 DIAGNOSIS — E039 Hypothyroidism, unspecified: Secondary | ICD-10-CM | POA: Diagnosis not present

## 2024-12-10 DIAGNOSIS — H6123 Impacted cerumen, bilateral: Secondary | ICD-10-CM | POA: Diagnosis not present

## 2024-12-10 DIAGNOSIS — G301 Alzheimer's disease with late onset: Secondary | ICD-10-CM

## 2024-12-10 DIAGNOSIS — L4050 Arthropathic psoriasis, unspecified: Secondary | ICD-10-CM | POA: Diagnosis not present

## 2024-12-10 DIAGNOSIS — I2583 Coronary atherosclerosis due to lipid rich plaque: Secondary | ICD-10-CM

## 2024-12-10 DIAGNOSIS — I10 Essential (primary) hypertension: Secondary | ICD-10-CM | POA: Diagnosis not present

## 2024-12-10 DIAGNOSIS — F0280 Dementia in other diseases classified elsewhere without behavioral disturbance: Secondary | ICD-10-CM | POA: Diagnosis not present

## 2024-12-10 NOTE — Progress Notes (Unsigned)
 " Location:  Other [facility].  Nursing Home Room Number: [facility] [id number] Place of Service:  ALF [phone]) Provider:  Greig Cluster, NP  PCP: Laurence Locus, DO  Patient Care Team: Laurence Locus, DO as PCP - General (Internal Medicine)  Extended Emergency Contact Information Primary Emergency Contact: Weatherly,Mike Address: [address]          [place], [place] Home Phone: [phone] Mobile Phone: [phone] Relation: Son Secondary Emergency Contact: Vannie Inocente Sales, [place] Phone: [phone] Relation: Daughter  Code Status:  Full Code Goals of care: Advanced Directive information    10/17/2024    1:06 PM  Advanced Directives  Does Patient Have a Medical Advance Directive? No  Would patient like information on creating a medical advance directive? No - Patient declined     Chief Complaint  Patient presents with   Medical Management of Chronic Issues    Medical Management of Chronic Issues.     HPI:  Pt is a 89 y.o. female seen today for medical management of chronic diseases.    She currently resides on the memory care unit at [facility]. PMH: CAD s/p CABG x 4 2008, HTN, HLD, hypothyroidism, AD, psoriatic arthritis and chronic back pain.   Hypothyroidism- TSH 3.125 07/12/2024, remains on levothyroxine  Alzheimer's dementia- CT head with chronic small vessel ischemic changes> progression from 2020 study 09/2023, stable mood, dependent with some ADLs, weight stable, ambulates with rolator, social with others, not on medication CAD- h/o CABG 2008, off asa, declined statins in past HTN- BUN/creat 19/1.1 07/12/2024, not on medication, see trends below Psoriatic arthritis- stable with Humira injections every 14 days  Recent weights:  12/04- 128.8 lbs  11/05- 128.8 lbs  10/04- 126 lbs  Recent blood pressures:  12/17- 126/70  12/10- 128/78  12/03- 147/22  Past Medical History:  Diagnosis Date    Acute cholecystitis    Acute renal failure (ARF)    history of    denies   Arthritis    Cerebrovascular disease    Chronic diarrhea    Complication of anesthesia    doesnt take a lot  medicine   Coronary artery disease    CABG  x4 LIMA to LAD saphenous vein graft to diagonal, saphenous vein graft to circumflex marginal, saphenous vein graft to distal RAD -- Endoscopic vein harvesting of conduit   Fuch's endothelial dystrophy    Hyperlipidemia    Hypertension    Hypothyroidism    Macular degeneration disease    Pancreatitis    Past Surgical History:  Procedure Laterality Date   APPENDECTOMY     BACK SURGERY     CARDIAC CATHETERIZATION  02/14/2007   Three-vessel coronary artery disease with small, diffusely diseased vessels -- normal LV function with EF of 55%   CHOLECYSTECTOMY     11   CHOLECYSTECTOMY, LAPAROSCOPIC  01/28/2010   Acute cholecystitis.   CORONARY ARTERY BYPASS GRAFT  02/15/2007   x4 LIMA to LAD saphenous vein graft to diagonal, saphenous vein graft to circumflex marginal, saphenous vein graft to distal RAD -- Endoscopic vein harvesting of conduit   ERCP W/ SPHINCTEROTOMY AND BALLOON DILATION  12/26/2007   HEMORRHOID SURGERY  10/03/2007   KNEE ARTHROSCOPY Right 6/14   LUMBAR LAMINECTOMY/DECOMPRESSION MICRODISCECTOMY N/A 01/31/2014   Procedure: L3-5 DECOMPRESSION/REMOVAL OF FACET CYST;  Surgeon: Donaciano Sprang, MD;  Location: [facility] OR;  Service: Orthopedics;  Laterality: N/A;  Allergies[1]  Outpatient Encounter Medications as of 12/10/2024  Medication Sig   acetaminophen  (TYLENOL ) 500 MG tablet Take 500 mg by mouth every 8 (eight) hours as needed. Give one capsule by mouth at bedtime.   Adalimumab (HUMIRA [place]) 40 Units every 14 (fourteen) days.   Apoaequorin (PREVAGEN EXTRA STRENGTH PO) Take 1 capsule by mouth daily.   carbamide peroxide (DEBROX) 6.5 % OTIC solution Place 5 drops into both ears 2 (two) times daily.   hydroxypropyl methylcellulose / hypromellose  (ISOPTO TEARS / GONIOVISC) 2.5 % ophthalmic solution Place 1 drop into both eyes every 6 (six) hours as needed for dry eyes.   levothyroxine  (SYNTHROID , LEVOTHROID) 50 MCG tablet Take 50 mcg by mouth daily.    Multiple Vitamins-Minerals (PRESERVISION AREDS 2) CAPS Take 1 capsule by mouth 2 (two) times daily.   Acetaminophen  (TYLENOL ) 325 MG CAPS Take 1 tablet by mouth. (Patient not taking: Reported on 12/10/2024)   aspirin EC 81 MG tablet Take 81 mg by mouth daily. (Patient not taking: Reported on 12/10/2024)   bisacodyl (DULCOLAX) 5 MG EC tablet Take by mouth. (Patient not taking: Reported on 12/10/2024)   doxycycline  (VIBRA -TABS) 100 MG tablet Take 1 tablet (100 mg total) by mouth 2 (two) times daily. (Patient not taking: Reported on 12/10/2024)   MYRBETRIQ 25 MG TB24 tablet Take 25 mg by mouth daily. (Patient not taking: Reported on 12/10/2024)   olopatadine  (PATANOL) 0.1 % ophthalmic solution Place 1 drop into both eyes 2 (two) times daily. (Patient not taking: Reported on 12/10/2024)   UNABLE TO FIND Balance of [place] supplement (Patient not taking: Reported on 12/10/2024)   No facility-administered encounter medications on file as of 12/10/2024.    Review of Systems  Unable to perform ROS: Dementia    Immunization History  Administered Date(s) Administered   INFLUENZA, HIGH DOSE SEASONAL PF 09/16/2017   Influenza-Unspecified 10/02/2024   Moderna Sars-Covid-2 Vaccination 01/21/2020, 02/11/2020   PPD Test 02/03/2013   Tdap 09/29/2023   Zoster Recombinant(Shingrix) 11/09/2017   Pertinent  Health Maintenance Due  Topic Date Due   Bone Density Scan  Never done   Influenza Vaccine  Completed      02/15/2019   12:15 PM 07/02/2020    1:26 PM  Fall Risk  (RETIRED) Patient Fall Risk Level Low fall risk  High fall risk      Data saved with a previous flowsheet row definition   Functional Status Survey:    Vitals:   12/10/24 1129  BP: 126/70  Pulse: 80  Resp: 17  Temp: 98.5  F (36.9 C)  SpO2: 96%  Weight: 128 lb 12.8 oz (58.4 kg)  Height: 5' 1 (1.549 m)   Body mass index is 24.34 kg/m. Physical Exam Vitals reviewed.  Constitutional:      General: She is not in acute distress. HENT:     Head: Normocephalic.     Right Ear: There is impacted cerumen.     Left Ear: There is impacted cerumen.     Nose: Nose normal.     Mouth/Throat:     Mouth: Mucous membranes are moist.  Eyes:     General:        Right eye: No discharge.        Left eye: No discharge.  Cardiovascular:     Rate and Rhythm: Normal rate and regular rhythm.     Pulses: Normal pulses.     Heart sounds: Normal heart sounds.  Pulmonary:     Effort: Pulmonary  effort is normal.     Breath sounds: Normal breath sounds.  Abdominal:     General: Bowel sounds are normal.     Palpations: Abdomen is soft.  Musculoskeletal:     Cervical back: Neck supple.     Right lower leg: No edema.     Left lower leg: No edema.  Skin:    General: Skin is warm.     Capillary Refill: Capillary refill takes less than 2 seconds.  Neurological:     General: No focal deficit present.     Mental Status: She is alert. Mental status is at baseline.     Gait: Gait abnormal.  Psychiatric:        Mood and Affect: Mood normal.     Labs reviewed: No results for input(s): NA, K, CL, CO2, GLUCOSE, BUN, CREATININE, CALCIUM, MG, PHOS in the last 8760 hours. No results for input(s): AST, ALT, ALKPHOS, BILITOT, PROT, ALBUMIN in the last 8760 hours. No results for input(s): WBC, NEUTROABS, HGB, HCT, MCV, PLT in the last 8760 hours. Lab Results  Component Value Date   TSH 1.55 02/19/2009   No results found for: HGBA1C Lab Results  Component Value Date   CHOL 148 02/19/2009   HDL 34.0 (L) 02/19/2009   LDLCALC (H) 09/23/2007    144        Total Cholesterol/HDL:CHD Risk Coronary Heart Disease Risk Table                     Men   Women  1/2 Average Risk   3.4   3.3    LDLDIRECT 74.1 02/19/2009   TRIG 279 (HH) 02/19/2009   CHOLHDL 4.4 CALC 02/19/2009    Significant Diagnostic Results in last 30 days:  No results found.  Assessment/Plan 1. Cerumen debris on tympanic membrane of both ears (Primary) - start Debrox BID x 5 days - flush ears when debrox complete  2. Acquired hypothyroidism - TSH stable with hypothyroidism  3. Alzheimer's type dementia with late onset without behavioral disturbance (HCC) - no behaviors  - dependent with ADLs  - ambulates with rolator - weight stable  - cont memory care  4. Coronary artery disease due to lipid rich plaque - h/o CABG 2008 - off asa and statin   5. Essential hypertension - controlled without medication   6. Psoriatic arthritis (HCC) - cont Humira injections every 14 days     Family/ staff Communication: plan discussed with patient and nurse  Labs/tests ordered:  routine labs 12/2024        [1]  Allergies Allergen Reactions   Phenytoin Sodium Extended     WHICH HAS CAUSED HER liver FAILURE   Epinephrine     Nitroglycerin     High sensitivity   Valium     High sensitivity   Hibiclens [Chlorhexidine Gluconate] Rash   "

## 2024-12-17 ENCOUNTER — Encounter: Payer: Self-pay | Admitting: Orthopedic Surgery

## 2024-12-17 ENCOUNTER — Non-Acute Institutional Stay: Payer: Self-pay | Admitting: Orthopedic Surgery

## 2024-12-17 DIAGNOSIS — Z0000 Encounter for general adult medical examination without abnormal findings: Secondary | ICD-10-CM

## 2024-12-17 NOTE — Patient Instructions (Signed)
 [name],  Thank you for taking the time for your Medicare Wellness Visit. I appreciate your continued commitment to your health goals. Please review the care plan we discussed, and feel free to reach out if I can assist you further.  Please note that Annual Wellness Visits do not include a physical exam. Some assessments may be limited, especially if the visit was conducted virtually. If needed, we may recommend an in-person follow-up with your provider.  Ongoing Care Seeing your primary care provider every 3 to 6 months helps us  monitor your health and provide consistent, personalized care.   Referrals If a referral was made during today's visit and you haven't received any updates within two weeks, please contact the referred provider directly to check on the status.  Recommended Screenings:  Health Maintenance  Topic Date Due   Pneumococcal Vaccine for age over 71 (1 of 2 - PCV) Never done   Osteoporosis screening with Bone Density Scan  Never done   Zoster (Shingles) Vaccine (2 of 2) 01/04/2018   COVID-19 Vaccine (3 - Moderna risk series) 03/10/2020   Medicare Annual Wellness Visit  12/17/2025   DTaP/Tdap/Td vaccine (2 - Td or Tdap) 09/28/2033   Flu Shot  Completed   Meningitis B Vaccine  Aged Out       12/17/2024   12:48 PM  Advanced Directives  Does Patient Have a Medical Advance Directive? Yes  Type of Estate Agent of [place];Living will  Does patient want to make changes to medical advance directive? No - Patient declined  Copy of Healthcare Power of Attorney in Chart? No - copy requested    Vision: Annual vision screenings are recommended for early detection of glaucoma, cataracts, and diabetic retinopathy. These exams can also reveal signs of chronic conditions such as diabetes and high blood pressure.  Dental: Annual dental screenings help detect early signs of oral cancer, gum disease, and other conditions linked to overall health, including  heart disease and diabetes.  Please see the attached documents for additional preventive care recommendations.

## 2024-12-17 NOTE — Progress Notes (Signed)
 "  Chief Complaint  Patient presents with   Medicare Wellness    AWV     Subjective:   Kiara  N Hale is a 89 y.o. female who presents for a Welcome to Medicare Exam.   Visit info / Clinical Intake: Medicare Wellness Visit Type:: Subsequent Annual Wellness Visit Persons participating in visit and providing information:: patient & caregiver Medicare Wellness Visit Mode:: In-person (required for WTM) Interpreter Needed?: No Pre-visit prep was completed: no AWV questionnaire completed by patient prior to visit?: no Living arrangements:: in nursing facility; in retirement community Patient's Overall Health Status Rating: good Typical amount of pain: some Does pain affect daily life?: (!) yes Are you currently prescribed opioids?: no  Dietary Habits and Nutritional Risks How many meals a day?: 3 Eats fruit and vegetables daily?: yes Most meals are obtained by: having others provide food In the last 2 weeks, have you had any of the following?: none Diabetic:: no  Functional Status Activities of Daily Living (to include ambulation/medication): (!) Needs Assist Feeding: Independent Dressing/Grooming: Needs assistance Bathing: Needs assistance Toileting: Needs assistance Transfer: Needs assistance Ambulation: Needs Assistance Home Management (perform basic housework or laundry): Dependent Manage your own finances?: (!) no Primary transportation is: facility / other Concerns about vision?: no *vision screening is required for WTM* Concerns about hearing?: no  Fall Screening Falls in the past year?: 1 Number of falls in past year: 0 Was there an injury with Fall?: 0 Fall Risk Category Calculator: 1 Patient Fall Risk Level: Low Fall Risk  Fall Risk Patient at Risk for Falls Due to: History of fall(s); Impaired balance/gait Fall risk Follow up: Falls evaluation completed  Home and Transportation Safety: All rugs have non-skid backing?: N/A, no rugs All stairs or steps  have railings?: N/A, no stairs Grab bars in the bathtub or shower?: yes Have non-skid surface in bathtub or shower?: yes Good home lighting?: yes Regular seat belt use?: yes Hospital stays in the last year:: no  Cognitive Assessment Difficulty concentrating, remembering, or making decisions? : yes Will 6CIT or Mini Cog be Completed: no 6CIT or Mini Cog Declined: patient has a diagnosis of dementia or cognitive impairment  Advance Directives (For Healthcare) Does Patient Have a Medical Advance Directive?: Yes Does patient want to make changes to medical advance directive?: No - Patient declined Type of Advance Directive: Healthcare Power of [place]; Living will Copy of Healthcare Power of Attorney in Chart?: No - copy requested Healthcare Power of Attorney Requested and Now in Chart: Copy in chart Copy of Living Will in Chart?: No - copy requested Living Will Requested and Now in Chart: Copy in chart Would patient like information on creating a medical advance directive?: No - Patient declined  Reviewed/Updated  Reviewed/Updated: Reviewed All (Medical, Surgical, Family, Medications, Allergies, Care Teams, Patient Goals)    Allergies (verified) Phenytoin sodium extended, Epinephrine , Nitroglycerin, Valium, and Hibiclens [chlorhexidine gluconate]   Current Medications (verified) Outpatient Encounter Medications as of 12/17/2024  Medication Sig   acetaminophen  (TYLENOL ) 500 MG tablet Take 500 mg by mouth every 8 (eight) hours as needed. Give one capsule by mouth at bedtime.   Adalimumab (HUMIRA [place]) 40 Units every 14 (fourteen) days.   Apoaequorin (PREVAGEN EXTRA STRENGTH PO) Take 1 capsule by mouth daily.   carbamide peroxide (DEBROX) 6.5 % OTIC solution Place 5 drops into both ears 2 (two) times daily.   hydroxypropyl methylcellulose / hypromellose (ISOPTO TEARS / GONIOVISC) 2.5 % ophthalmic solution Place 1 drop into both eyes every  6 (six) hours as needed for dry eyes.    levothyroxine  (SYNTHROID , LEVOTHROID) 50 MCG tablet Take 50 mcg by mouth daily.    Multiple Vitamins-Minerals (PRESERVISION AREDS 2) CAPS Take 1 capsule by mouth 2 (two) times daily.   No facility-administered encounter medications on file as of 12/17/2024.    History: Past Medical History:  Diagnosis Date   Acute cholecystitis    Acute renal failure (ARF)    history of    denies   Arthritis    Cerebrovascular disease    Chronic diarrhea    Complication of anesthesia    doesnt take a lot  medicine   Coronary artery disease    CABG  x4 LIMA to LAD saphenous vein graft to diagonal, saphenous vein graft to circumflex marginal, saphenous vein graft to distal RAD -- Endoscopic vein harvesting of conduit   Fuch's endothelial dystrophy    Hyperlipidemia    Hypertension    Hypothyroidism    Macular degeneration disease    Pancreatitis    Past Surgical History:  Procedure Laterality Date   APPENDECTOMY     BACK SURGERY     CARDIAC CATHETERIZATION  02/14/2007   Three-vessel coronary artery disease with small, diffusely diseased vessels -- normal LV function with EF of 55%   CHOLECYSTECTOMY     13   CHOLECYSTECTOMY, LAPAROSCOPIC  01/28/2010   Acute cholecystitis.   CORONARY ARTERY BYPASS GRAFT  02/15/2007   x4 LIMA to LAD saphenous vein graft to diagonal, saphenous vein graft to circumflex marginal, saphenous vein graft to distal RAD -- Endoscopic vein harvesting of conduit   ERCP W/ SPHINCTEROTOMY AND BALLOON DILATION  12/26/2007   HEMORRHOID SURGERY  10/03/2007   KNEE ARTHROSCOPY Right 6/14   LUMBAR LAMINECTOMY/DECOMPRESSION MICRODISCECTOMY N/A 01/31/2014   Procedure: L3-5 DECOMPRESSION/REMOVAL OF FACET CYST;  Surgeon: Donaciano Sprang, MD;  Location: [facility] OR;  Service: Orthopedics;  Laterality: N/A;   Family History  Problem Relation Age of Onset   Heart attack Mother 66   Heart attack Father 67       heavy smoker   Social History   Occupational History   Not on file   Tobacco Use   Smoking status: Former    Current packs/day: 0.00    Average packs/day: 0.3 packs/day for 4.0 years (1.0 ttl pk-yrs)    Types: Cigarettes    Start date: 01/25/1949    Quit date: 01/25/1953    Years since quitting: 71.9   Smokeless tobacco: Never  Vaping Use   Vaping status: Never Used  Substance and Sexual Activity   Alcohol use: Not Currently    Comment: no alcohol in many many years, maybe before age   Drug use: Never   Sexual activity: Not on file   Tobacco Counseling Counseling given: Not Answered  SDOH Screenings   Food Insecurity: No Food Insecurity (07/12/2024)   Received from [facility]  Housing: Low Risk  (07/12/2024)   Received from [facility]  Transportation Needs: No Transportation Needs (07/12/2024)   Received from [facility]  Utilities: Not At Risk (07/12/2024)   Received from [facility]  Financial Resource Strain: Low Risk  (07/12/2024)   Received from [facility]  Tobacco Use: Medium Risk (12/17/2024)   See flowsheets for full screening details  Depression Screen Depression Screening Exception Documentation Depression Screening Exception:: Medical reason (dementia)      Goals Addressed  This Visit's Progress    Maintain Mobility and Function   On track    Evidence-based guidance:  Acknowledge and validate impact of pain, loss of strength and potential disfigurement (hand osteoarthritis) on mental health and daily life, such as social isolation, anxiety, depression, impaired sexual relationship and   injury from falls.  Anticipate referral to physical or occupational therapy for assessment, therapeutic exercise and recommendation for adaptive equipment or assistive devices; encourage participation.  Assess impact on ability to perform activities of daily living, as well as engage in sports and leisure events or requirements of work or  school.  Provide anticipatory guidance and reassurance about the benefit of exercise to maintain function; acknowledge and normalize fear that exercise may worsen symptoms.  Encourage regular exercise, at least 10 minutes at a time for 45 minutes per week; consider yoga, water exercise and proprioceptive exercises; encourage use of wearable activity tracker to increase motivation and adherence.  Encourage maintenance or resumption of daily activities, including employment, as pain allows and with minimal exposure to trauma.  Assist patient to advocate for adaptations to the work environment.  Consider level of pain and function, gender, age, lifestyle, patient preference, quality of life, readiness and capacity to benefit when recommending patients for orthopaedic surgery consultation.  Explore strategies, such as changes to medication regimen or activity that enables patient to anticipate and manage flare-ups that increase deconditioning and disability.  Explore patient preferences; encourage exposure to a broader range of activities that have been avoided for fear of experiencing pain.  Identify barriers to participation in therapy or exercise, such as pain with activity, anticipated or imagined pain.  Monitor postoperative joint replacement or any preexisting joint replacement for ongoing pain and loss of function; provide social support and encouragement throughout recovery.   Notes:              Objective:    Today's Vitals   12/17/24 0825  BP: 115/73  Pulse: 89  Resp: 16  Temp: (!) 97.3 F (36.3 C)  SpO2: 96%  Weight: 128 lb 12.8 oz (58.4 kg)  Height: 5' 1 (1.549 m)   Body mass index is 24.34 kg/m.   Physical Exam    Hearing/Vision screen No results found. Immunizations and Health Maintenance Health Maintenance  Topic Date Due   Pneumococcal Vaccine: 50+ Years (1 of 2 - PCV) Never done   Bone Density Scan  Never done   Zoster Vaccines- Shingrix (2 of 2)  01/04/2018   COVID-19 Vaccine (3 - Moderna risk series) 03/10/2020   Medicare Annual Wellness (AWV)  12/17/2025   DTaP/Tdap/Td (2 - Td or Tdap) 09/28/2033   Influenza Vaccine  Completed   Meningococcal B Vaccine  Aged Out    EKG: normal EKG, normal sinus rhythm, unchanged from previous tracings     Assessment/Plan:  This is a routine wellness examination for [name] .  Patient Care Team: Laurence Locus, DO as PCP - General (Internal Medicine)  I have personally reviewed and noted the following in the patients chart:   Medical and social history Use of alcohol, tobacco or illicit drugs  Current medications and supplements including opioid prescriptions. Functional ability and status Nutritional status Physical activity Advanced directives List of other physicians Hospitalizations, surgeries, and ER visits in previous 12 months Vitals Screenings to include cognitive, depression, and falls Referrals and appointments  No orders of the defined types were placed in this encounter.  In addition, I have reviewed and discussed with patient certain preventive protocols, quality  metrics, and best practice recommendations. A written personalized care plan for preventive services as well as general preventive health recommendations were provided to patient.   Greig FORBES Cluster, NP   12/17/2024   Return in 1 year (on 12/17/2025).   Nurse notes: Recommend Prevnar 20 pneumonia vaccine. No future mammograms due to age. Still ambulating. No recent DEXA scan.  "

## 2025-01-04 ENCOUNTER — Non-Acute Institutional Stay: Payer: Self-pay | Admitting: Orthopedic Surgery

## 2025-01-04 ENCOUNTER — Encounter: Payer: Self-pay | Admitting: Orthopedic Surgery

## 2025-01-04 DIAGNOSIS — E039 Hypothyroidism, unspecified: Secondary | ICD-10-CM

## 2025-01-04 DIAGNOSIS — I2583 Coronary atherosclerosis due to lipid rich plaque: Secondary | ICD-10-CM | POA: Diagnosis not present

## 2025-01-04 DIAGNOSIS — F0280 Dementia in other diseases classified elsewhere without behavioral disturbance: Secondary | ICD-10-CM

## 2025-01-04 DIAGNOSIS — L4050 Arthropathic psoriasis, unspecified: Secondary | ICD-10-CM

## 2025-01-04 DIAGNOSIS — I2510 Atherosclerotic heart disease of native coronary artery without angina pectoris: Secondary | ICD-10-CM

## 2025-01-04 DIAGNOSIS — I10 Essential (primary) hypertension: Secondary | ICD-10-CM

## 2025-01-04 DIAGNOSIS — G301 Alzheimer's disease with late onset: Secondary | ICD-10-CM

## 2025-01-04 NOTE — Progress Notes (Signed)
 " Location:  Other [place].  Nursing Home Room Number: [facility] [id number] Place of Service:  ALF [phone]) Provider:  Greig Cluster, NP  PCP: Laurence Locus, DO  Patient Care Team: Laurence Locus, DO as PCP - General (Internal Medicine)  Extended Emergency Contact Information Primary Emergency Contact: Pensinger,Mike Address: [address]          [place], [place] Home Phone: [phone] Mobile Phone: [phone] Relation: Son Secondary Emergency Contact: Vannie Inocente Sales, [place] Phone: [phone] Relation: Daughter  Code Status:  Full Goals of care: Advanced Directive information    12/17/2024   12:48 PM  Advanced Directives  Does Patient Have a Medical Advance Directive? Yes  Type of Estate Agent of [place];Living will  Does patient want to make changes to medical advance directive? No - Patient declined  Copy of Healthcare Power of Attorney in Chart? No - copy requested     Chief Complaint  Patient presents with   Medical Management of Chronic Issues    Medical Management of Chronic Issues.     HPI:  Pt is a 89 y.o. female seen today for medical management of chronic diseases.    She currently resides on the memory care unit at [facility]. PMH: CAD s/p CABG x 4 2008, HTN, HLD, hypothyroidism, AD, psoriatic arthritis and chronic back pain.    HTN- BUN/creat 19/1.1 07/12/2024, not on medication, see trends below Hypothyroidism- TSH 3.125 07/12/2024, remains on levothyroxine  Alzheimer's dementia- CT head with chronic small vessel ischemic changes> progression from 2020 study 09/2023, stable mood, dependent with some ADLs, weight stable, ambulates with rolator, social with others, not on medication CAD- h/o CABG 2008, off asa, declined statins in past Psoriatic arthritis- stable with Humira injections every 14 days  No concerns per nursing. Very polite and talkative during  encounter. Denied any concerns. No recent falls or injuries per chart review.   01/07 Prevnar 20 administered, tolerated well.   Recent weights:  01/04- 129 lbs  12/04- 128.8 lbs  11/05- 128.8 lbs  Recent blood pressures:  01/04- 148/81  01/07- 148/73  12/31- 128/77     Past Medical History:  Diagnosis Date   Acute cholecystitis    Acute renal failure (ARF)    history of    denies   Arthritis    Cerebrovascular disease    Chronic diarrhea    Complication of anesthesia    doesnt take a lot  medicine   Coronary artery disease    CABG  x4 LIMA to LAD saphenous vein graft to diagonal, saphenous vein graft to circumflex marginal, saphenous vein graft to distal RAD -- Endoscopic vein harvesting of conduit   Fuch's endothelial dystrophy    Hyperlipidemia    Hypertension    Hypothyroidism    Macular degeneration disease    Pancreatitis    Past Surgical History:  Procedure Laterality Date   APPENDECTOMY     BACK SURGERY     CARDIAC CATHETERIZATION  02/14/2007   Three-vessel coronary artery disease with small, diffusely diseased vessels -- normal LV function with EF of 55%   CHOLECYSTECTOMY     13   CHOLECYSTECTOMY, LAPAROSCOPIC  01/28/2010   Acute cholecystitis.   CORONARY ARTERY BYPASS GRAFT  02/15/2007   x4 LIMA to LAD saphenous vein graft to diagonal, saphenous vein graft to circumflex marginal, saphenous vein graft to distal RAD -- Endoscopic vein  harvesting of conduit   ERCP W/ SPHINCTEROTOMY AND BALLOON DILATION  12/26/2007   HEMORRHOID SURGERY  10/03/2007   KNEE ARTHROSCOPY Right 6/14   LUMBAR LAMINECTOMY/DECOMPRESSION MICRODISCECTOMY N/A 01/31/2014   Procedure: L3-5 DECOMPRESSION/REMOVAL OF FACET CYST;  Surgeon: Donaciano Sprang, MD;  Location: [facility] OR;  Service: Orthopedics;  Laterality: N/A;    Allergies[1]  Outpatient Encounter Medications as of 01/04/2025  Medication Sig   acetaminophen  (TYLENOL ) 500 MG tablet Take 500 mg by mouth every 8 (eight) hours as  needed. Give one capsule by mouth at bedtime.   Adalimumab (HUMIRA [place]) 40 Units every 14 (fourteen) days.   Apoaequorin (PREVAGEN EXTRA STRENGTH PO) Take 1 capsule by mouth daily.   hydroxypropyl methylcellulose / hypromellose (ISOPTO TEARS / GONIOVISC) 2.5 % ophthalmic solution Place 1 drop into both eyes every 6 (six) hours as needed for dry eyes.   levothyroxine  (SYNTHROID , LEVOTHROID) 50 MCG tablet Take 50 mcg by mouth daily.    Multiple Vitamins-Minerals (PRESERVISION AREDS 2) CAPS Take 1 capsule by mouth 2 (two) times daily.   carbamide peroxide (DEBROX) 6.5 % OTIC solution Place 5 drops into both ears 2 (two) times daily. (Patient not taking: Reported on 01/04/2025)   No facility-administered encounter medications on file as of 01/04/2025.    Review of Systems  Unable to perform ROS: Dementia    Immunization History  Administered Date(s) Administered   INFLUENZA, HIGH DOSE SEASONAL PF 09/16/2017   Influenza-Unspecified 10/02/2024   Moderna Sars-Covid-2 Vaccination 01/21/2020, 02/11/2020   PPD Test 02/03/2013   Tdap 09/29/2023   Zoster Recombinant(Shingrix) 11/09/2017   Pertinent  Health Maintenance Due  Topic Date Due   Bone Density Scan  Never done   Influenza Vaccine  Completed      02/15/2019   12:15 PM 07/02/2020    1:26 PM 12/11/2024    1:31 PM 12/17/2024   12:48 PM  Fall Risk  Falls in the past year?   1 1  Was there an injury with Fall?   0 0  Fall Risk Category Calculator   1 1  (RETIRED) Patient Fall Risk Level Low fall risk  High fall risk     Patient at Risk for Falls Due to   Impaired balance/gait History of fall(s);Impaired balance/gait  Fall risk Follow up   Falls evaluation completed Falls evaluation completed     Data saved with a previous flowsheet row definition   Functional Status Survey:    Vitals:   01/04/25 0944  BP: (!) 148/81  Pulse: 74  Resp: 17  Temp: 97.8 F (36.6 C)  SpO2: 96%  Weight: 129 lb (58.5 kg)  Height: 5' 1 (1.549 m)    Body mass index is 24.37 kg/m. Physical Exam Vitals reviewed.  HENT:     Head: Normocephalic.     Right Ear: There is no impacted cerumen.     Left Ear: There is no impacted cerumen.     Nose: Nose normal.     Mouth/Throat:     Mouth: Mucous membranes are moist.  Eyes:     General:        Right eye: No discharge.        Left eye: No discharge.  Cardiovascular:     Rate and Rhythm: Normal rate and regular rhythm.     Pulses: Normal pulses.     Heart sounds: Normal heart sounds.  Pulmonary:     Effort: Pulmonary effort is normal. No respiratory distress.     Breath sounds: Normal  breath sounds. No wheezing or rales.  Abdominal:     General: Bowel sounds are normal. There is no distension.     Palpations: Abdomen is soft.     Tenderness: There is no abdominal tenderness.  Musculoskeletal:     Cervical back: Neck supple.     Right lower leg: No edema.     Left lower leg: No edema.  Skin:    General: Skin is warm.     Capillary Refill: Capillary refill takes less than 2 seconds.  Neurological:     General: No focal deficit present.     Mental Status: She is alert. Mental status is at baseline.     Gait: Gait abnormal.  Psychiatric:        Mood and Affect: Mood normal.     Comments: Very pleasant, alert to self/person/place, follows commands     Labs reviewed: No results for input(s): NA, K, CL, CO2, GLUCOSE, BUN, CREATININE, CALCIUM, MG, PHOS in the last 8760 hours. No results for input(s): AST, ALT, ALKPHOS, BILITOT, PROT, ALBUMIN in the last 8760 hours. No results for input(s): WBC, NEUTROABS, HGB, HCT, MCV, PLT in the last 8760 hours. Lab Results  Component Value Date   TSH 1.55 02/19/2009   No results found for: HGBA1C Lab Results  Component Value Date   CHOL 148 02/19/2009   HDL 34.0 (L) 02/19/2009   LDLCALC (H) 09/23/2007    144        Total Cholesterol/HDL:CHD Risk Coronary Heart Disease Risk Table                      Men   Women  1/2 Average Risk   3.4   3.3   LDLDIRECT 74.1 02/19/2009   TRIG 279 (HH) 02/19/2009   CHOLHDL 4.4 CALC 02/19/2009    Significant Diagnostic Results in last 30 days:  No results found.  Assessment/Plan 1. Essential hypertension (Primary) - controlled, goal < 150/90 - not on medication  2. Acquired hypothyroidism - TSH stable - cont levothyroxine   3. Alzheimer's type dementia with late onset without behavioral disturbance (HCC) - no agitation - dependent with some ADLs> needs cueing/reminders - ambulates with rolator - weight stable - not on medication   4. Coronary artery disease due to lipid rich plaque - h/o CABG 2008 - not on medication   5. Psoriatic arthritis (HCC) - cont Humira injections     Family/ staff Communication: plan discussed with patient and nurse  Labs/tests ordered:  cbc/diff, cmp, TSH 01/10/2025        [1]  Allergies Allergen Reactions   Phenytoin Sodium Extended     WHICH HAS CAUSED HER liver FAILURE   Epinephrine     Nitroglycerin     High sensitivity   Valium     High sensitivity   Hibiclens [Chlorhexidine Gluconate] Rash   "
